# Patient Record
Sex: Male | Born: 1960 | Race: Black or African American | Hispanic: No | Smoking: Never smoker
Health system: Southern US, Community
[De-identification: ages and names within clinical notes are randomized; demographics above are authoritative.]

## PROBLEM LIST (undated history)

## (undated) DIAGNOSIS — R400 Somnolence: Secondary | ICD-10-CM

## (undated) DIAGNOSIS — R079 Chest pain, unspecified: Secondary | ICD-10-CM

## (undated) DIAGNOSIS — I119 Hypertensive heart disease without heart failure: Secondary | ICD-10-CM

## (undated) DIAGNOSIS — R9431 Abnormal electrocardiogram [ECG] [EKG]: Secondary | ICD-10-CM

## (undated) DIAGNOSIS — I480 Paroxysmal atrial fibrillation: Secondary | ICD-10-CM

## (undated) DIAGNOSIS — R002 Palpitations: Secondary | ICD-10-CM

## (undated) DIAGNOSIS — E119 Type 2 diabetes mellitus without complications: Secondary | ICD-10-CM

## (undated) DIAGNOSIS — G4726 Circadian rhythm sleep disorder, shift work type: Secondary | ICD-10-CM

## (undated) DIAGNOSIS — E785 Hyperlipidemia, unspecified: Secondary | ICD-10-CM

## (undated) DIAGNOSIS — I10 Essential (primary) hypertension: Secondary | ICD-10-CM

## (undated) DIAGNOSIS — Z9989 Dependence on other enabling machines and devices: Secondary | ICD-10-CM

## (undated) DIAGNOSIS — G4733 Obstructive sleep apnea (adult) (pediatric): Secondary | ICD-10-CM

## (undated) DIAGNOSIS — U071 COVID-19: Secondary | ICD-10-CM

## (undated) HISTORY — DX: Essential (primary) hypertension: I10

## (undated) HISTORY — DX: Circadian rhythm sleep disorder, shift work type: G47.26

## (undated) HISTORY — DX: Dependence on other enabling machines and devices: Z99.89

## (undated) HISTORY — DX: Abnormal electrocardiogram (ECG) (EKG): R94.31

## (undated) HISTORY — DX: Somnolence: R40.0

## (undated) HISTORY — DX: Chest pain, unspecified: R07.9

## (undated) HISTORY — DX: Palpitations: R00.2

## (undated) HISTORY — DX: Obstructive sleep apnea (adult) (pediatric): G47.33

## (undated) HISTORY — DX: COVID-19: U07.1

## (undated) HISTORY — DX: Hypertensive heart disease without heart failure: I11.9

## (undated) HISTORY — PX: BACK SURGERY: SHX140

---

## 1998-01-24 ENCOUNTER — Encounter: Admission: RE | Admit: 1998-01-24 | Discharge: 1998-04-24 | Payer: Self-pay | Admitting: Anesthesiology

## 1998-05-19 ENCOUNTER — Ambulatory Visit (HOSPITAL_COMMUNITY): Admission: RE | Admit: 1998-05-19 | Discharge: 1998-05-19 | Payer: Self-pay | Admitting: Neurosurgery

## 1998-05-19 ENCOUNTER — Encounter: Payer: Self-pay | Admitting: Neurosurgery

## 1998-06-07 ENCOUNTER — Observation Stay (HOSPITAL_COMMUNITY): Admission: RE | Admit: 1998-06-07 | Discharge: 1998-06-07 | Payer: Self-pay | Admitting: Neurosurgery

## 1998-06-07 ENCOUNTER — Encounter: Payer: Self-pay | Admitting: Neurosurgery

## 1998-09-27 ENCOUNTER — Encounter: Admission: RE | Admit: 1998-09-27 | Discharge: 1998-12-26 | Payer: Self-pay | Admitting: Family Medicine

## 1999-09-15 ENCOUNTER — Encounter: Admission: RE | Admit: 1999-09-15 | Discharge: 1999-09-15 | Payer: Self-pay | Admitting: Family Medicine

## 1999-09-15 ENCOUNTER — Encounter: Payer: Self-pay | Admitting: Family Medicine

## 2002-03-26 ENCOUNTER — Encounter: Admission: RE | Admit: 2002-03-26 | Discharge: 2002-05-26 | Payer: Self-pay | Admitting: Family Medicine

## 2002-06-23 ENCOUNTER — Encounter: Admission: RE | Admit: 2002-06-23 | Discharge: 2002-09-21 | Payer: Self-pay | Admitting: Family Medicine

## 2005-08-13 HISTORY — PX: CARDIAC CATHETERIZATION: SHX172

## 2007-10-08 ENCOUNTER — Ambulatory Visit: Payer: Self-pay | Admitting: Licensed Clinical Social Worker

## 2007-10-14 ENCOUNTER — Ambulatory Visit: Payer: Self-pay | Admitting: Licensed Clinical Social Worker

## 2007-10-20 ENCOUNTER — Ambulatory Visit: Payer: Self-pay | Admitting: Licensed Clinical Social Worker

## 2007-10-27 ENCOUNTER — Ambulatory Visit: Payer: Self-pay | Admitting: Licensed Clinical Social Worker

## 2007-11-03 ENCOUNTER — Ambulatory Visit: Payer: Self-pay | Admitting: Licensed Clinical Social Worker

## 2010-04-15 ENCOUNTER — Inpatient Hospital Stay (HOSPITAL_COMMUNITY): Admission: EM | Admit: 2010-04-15 | Discharge: 2010-04-17 | Payer: Self-pay | Admitting: Emergency Medicine

## 2010-10-26 LAB — GLUCOSE, CAPILLARY
Glucose-Capillary: 108 mg/dL — ABNORMAL HIGH (ref 70–99)
Glucose-Capillary: 119 mg/dL — ABNORMAL HIGH (ref 70–99)
Glucose-Capillary: 121 mg/dL — ABNORMAL HIGH (ref 70–99)
Glucose-Capillary: 128 mg/dL — ABNORMAL HIGH (ref 70–99)
Glucose-Capillary: 132 mg/dL — ABNORMAL HIGH (ref 70–99)
Glucose-Capillary: 145 mg/dL — ABNORMAL HIGH (ref 70–99)
Glucose-Capillary: 162 mg/dL — ABNORMAL HIGH (ref 70–99)
Glucose-Capillary: 164 mg/dL — ABNORMAL HIGH (ref 70–99)
Glucose-Capillary: 182 mg/dL — ABNORMAL HIGH (ref 70–99)
Glucose-Capillary: 187 mg/dL — ABNORMAL HIGH (ref 70–99)
Glucose-Capillary: 197 mg/dL — ABNORMAL HIGH (ref 70–99)
Glucose-Capillary: 213 mg/dL — ABNORMAL HIGH (ref 70–99)
Glucose-Capillary: 226 mg/dL — ABNORMAL HIGH (ref 70–99)
Glucose-Capillary: 275 mg/dL — ABNORMAL HIGH (ref 70–99)
Glucose-Capillary: 276 mg/dL — ABNORMAL HIGH (ref 70–99)
Glucose-Capillary: 308 mg/dL — ABNORMAL HIGH (ref 70–99)
Glucose-Capillary: 351 mg/dL — ABNORMAL HIGH (ref 70–99)
Glucose-Capillary: 477 mg/dL — ABNORMAL HIGH (ref 70–99)
Glucose-Capillary: >600 mg/dL (ref 70–99)
Glucose-Capillary: >600 mg/dL (ref 70–99)
Glucose-Capillary: >600 mg/dL (ref 70–99)

## 2010-10-26 LAB — CBC
HCT: 35.4 % — ABNORMAL LOW (ref 39.0–52.0)
HCT: 41.9 % (ref 39.0–52.0)
Hemoglobin: 11.6 g/dL — ABNORMAL LOW (ref 13.0–17.0)
Hemoglobin: 14.4 g/dL (ref 13.0–17.0)
MCH: 31.8 pg (ref 26.0–34.0)
MCHC: 34.4 g/dL (ref 30.0–36.0)
MCV: 92.5 fL (ref 78.0–100.0)
MCV: 92.6 fL (ref 78.0–100.0)
Platelets: 176 10*3/uL (ref 150–400)
Platelets: 222 10*3/uL (ref 150–400)
Platelets: 274 K/uL (ref 150–400)
RBC: 3.71 MIL/uL — ABNORMAL LOW (ref 4.22–5.81)
RBC: 3.82 MIL/uL — ABNORMAL LOW (ref 4.22–5.81)
RBC: 4.53 MIL/uL (ref 4.22–5.81)
RDW: 13.4 % (ref 11.5–15.5)
WBC: 12.8 10*3/uL — ABNORMAL HIGH (ref 4.0–10.5)
WBC: 16.5 K/uL — ABNORMAL HIGH (ref 4.0–10.5)
WBC: 8.3 10*3/uL (ref 4.0–10.5)

## 2010-10-26 LAB — COMPREHENSIVE METABOLIC PANEL
AST: 34 U/L (ref 0–37)
BUN: 64 mg/dL — ABNORMAL HIGH (ref 6–23)
CO2: 19 mEq/L (ref 19–32)
Chloride: 95 mEq/L — ABNORMAL LOW (ref 96–112)
Creatinine, Ser: 2.61 mg/dL — ABNORMAL HIGH (ref 0.4–1.5)
GFR calc Af Amer: 32 mL/min — ABNORMAL LOW (ref >60)
GFR calc non Af Amer: 26 mL/min — ABNORMAL LOW (ref >60)
Total Bilirubin: 2 mg/dL — ABNORMAL HIGH (ref 0.3–1.2)

## 2010-10-26 LAB — BLOOD GAS, VENOUS
Acid-base deficit: 7.4 mmol/L — ABNORMAL HIGH (ref 0.0–2.0)
Bicarbonate: 17.3 mEq/L — ABNORMAL LOW (ref 20.0–24.0)
FIO2: 0.21 %
O2 Saturation: 79.5 %
Patient temperature: 98.6
TCO2: 15.7 mmol/L (ref 0–100)
pCO2, Ven: 34.1 mmHg — ABNORMAL LOW (ref 45.0–50.0)
pH, Ven: 7.326 — ABNORMAL HIGH (ref 7.250–7.300)
pO2, Ven: 48.3 mmHg — ABNORMAL HIGH (ref 30.0–45.0)

## 2010-10-26 LAB — BASIC METABOLIC PANEL WITH GFR
CO2: 23 meq/L (ref 19–32)
Calcium: 7.9 mg/dL — ABNORMAL LOW (ref 8.4–10.5)
Chloride: 112 meq/L (ref 96–112)
GFR calc Af Amer: 47 mL/min — ABNORMAL LOW (ref >60)
Glucose, Bld: 318 mg/dL — ABNORMAL HIGH (ref 70–99)
Potassium: 4.2 meq/L (ref 3.5–5.1)
Sodium: 144 meq/L (ref 135–145)

## 2010-10-26 LAB — BASIC METABOLIC PANEL
BUN: 52 mg/dL — ABNORMAL HIGH (ref 6–23)
CO2: 24 mEq/L (ref 19–32)
CO2: 25 mEq/L (ref 19–32)
Calcium: 7.4 mg/dL — ABNORMAL LOW (ref 8.4–10.5)
Calcium: 7.5 mg/dL — ABNORMAL LOW (ref 8.4–10.5)
Calcium: 7.8 mg/dL — ABNORMAL LOW (ref 8.4–10.5)
Calcium: 8 mg/dL — ABNORMAL LOW (ref 8.4–10.5)
Chloride: 115 mEq/L — ABNORMAL HIGH (ref 96–112)
Creatinine, Ser: 0.97 mg/dL (ref 0.4–1.5)
Creatinine, Ser: 1.19 mg/dL (ref 0.4–1.5)
Creatinine, Ser: 1.86 mg/dL — ABNORMAL HIGH (ref 0.4–1.5)
GFR calc Af Amer: 57 mL/min — ABNORMAL LOW (ref >60)
GFR calc Af Amer: >60 mL/min (ref >60)
GFR calc Af Amer: >60 mL/min (ref >60)
GFR calc Af Amer: >60 mL/min (ref >60)
GFR calc non Af Amer: 39 mL/min — ABNORMAL LOW (ref >60)
GFR calc non Af Amer: 58 mL/min — ABNORMAL LOW (ref >60)
GFR calc non Af Amer: >60 mL/min (ref >60)
Glucose, Bld: 147 mg/dL — ABNORMAL HIGH (ref 70–99)
Potassium: 4.4 mEq/L (ref 3.5–5.1)
Sodium: 136 mEq/L (ref 135–145)
Sodium: 141 mEq/L (ref 135–145)
Sodium: 143 mEq/L (ref 135–145)
Sodium: 147 mEq/L — ABNORMAL HIGH (ref 135–145)

## 2010-10-26 LAB — DIFFERENTIAL
Basophils Absolute: 0 10*3/uL (ref 0.0–0.1)
Basophils Relative: 0 % (ref 0–1)
Basophils Relative: 1 % (ref 0–1)
Eosinophils Absolute: 0 K/uL (ref 0.0–0.7)
Eosinophils Relative: 0 % (ref 0–5)
Eosinophils Relative: 0 % (ref 0–5)
Eosinophils Relative: 0 % (ref 0–5)
Lymphocytes Relative: 12 % (ref 12–46)
Lymphocytes Relative: 7 % — ABNORMAL LOW (ref 12–46)
Lymphocytes Relative: 9 % — ABNORMAL LOW (ref 12–46)
Lymphs Abs: 1.1 10*3/uL (ref 0.7–4.0)
Lymphs Abs: 1.1 K/uL (ref 0.7–4.0)
Monocytes Absolute: 1.1 K/uL — ABNORMAL HIGH (ref 0.1–1.0)
Monocytes Relative: 2 % — ABNORMAL LOW (ref 3–12)
Monocytes Relative: 7 % (ref 3–12)
Neutro Abs: 10 10*3/uL — ABNORMAL HIGH (ref 1.7–7.7)
Neutro Abs: 14.3 K/uL — ABNORMAL HIGH (ref 1.7–7.7)
Neutrophils Relative %: 85 % — ABNORMAL HIGH (ref 43–77)
Neutrophils Relative %: 87 % — ABNORMAL HIGH (ref 43–77)

## 2010-10-26 LAB — COMPREHENSIVE METABOLIC PANEL WITH GFR
ALT: 27 U/L (ref 0–53)
Albumin: 4.4 g/dL (ref 3.5–5.2)
Alkaline Phosphatase: 68 U/L (ref 39–117)
Calcium: 8.4 mg/dL (ref 8.4–10.5)
Glucose, Bld: 770 mg/dL (ref 70–99)
Potassium: 4.3 meq/L (ref 3.5–5.1)
Sodium: 135 meq/L (ref 135–145)
Total Protein: 7.4 g/dL (ref 6.0–8.3)

## 2010-10-26 LAB — KETONES, QUALITATIVE

## 2010-10-26 LAB — PHOSPHORUS
Phosphorus: 2.5 mg/dL (ref 2.3–4.6)
Phosphorus: 2.5 mg/dL (ref 2.3–4.6)
Phosphorus: 3.4 mg/dL (ref 2.3–4.6)

## 2010-10-26 LAB — MAGNESIUM
Magnesium: 2.8 mg/dL — ABNORMAL HIGH (ref 1.5–2.5)
Magnesium: 2.9 mg/dL — ABNORMAL HIGH (ref 1.5–2.5)
Magnesium: 2.9 mg/dL — ABNORMAL HIGH (ref 1.5–2.5)

## 2010-10-26 LAB — URINALYSIS, ROUTINE W REFLEX MICROSCOPIC
Bilirubin Urine: NEGATIVE
Glucose, UA: >1000 mg/dL — AB
Hgb urine dipstick: NEGATIVE
Ketones, ur: 15 mg/dL — AB
Leukocytes, UA: NEGATIVE
Nitrite: NEGATIVE
Protein, ur: NEGATIVE mg/dL
Specific Gravity, Urine: 1.028 (ref 1.005–1.030)
Urobilinogen, UA: 0.2 mg/dL (ref 0.0–1.0)
pH: 5 (ref 5.0–8.0)

## 2010-10-26 LAB — CARDIAC PANEL(CRET KIN+CKTOT+MB+TROPI)
CK, MB: 3.2 ng/mL (ref 0.3–4.0)
CK, MB: 5.7 ng/mL — ABNORMAL HIGH (ref 0.3–4.0)
Relative Index: 1.1 (ref 0.0–2.5)
Relative Index: 1.8 (ref 0.0–2.5)
Total CK: 274 U/L — ABNORMAL HIGH (ref 7–232)
Total CK: 275 U/L — ABNORMAL HIGH (ref 7–232)
Troponin I: 0.02 ng/mL (ref 0.00–0.06)

## 2010-10-26 LAB — LIPASE, BLOOD: Lipase: 13 U/L (ref 11–59)

## 2010-10-26 LAB — HEMOGLOBIN A1C: Mean Plasma Glucose: 186 mg/dL — ABNORMAL HIGH (ref <117)

## 2010-10-26 LAB — URINE MICROSCOPIC-ADD ON

## 2011-11-27 ENCOUNTER — Other Ambulatory Visit: Payer: Self-pay | Admitting: Family Medicine

## 2011-11-27 DIAGNOSIS — M5416 Radiculopathy, lumbar region: Secondary | ICD-10-CM

## 2011-12-03 ENCOUNTER — Ambulatory Visit
Admission: RE | Admit: 2011-12-03 | Discharge: 2011-12-03 | Disposition: A | Discharge status: Home / Self Care | Payer: 59 | Source: Ambulatory Visit | Attending: Family Medicine | Admitting: Family Medicine

## 2011-12-03 DIAGNOSIS — M5416 Radiculopathy, lumbar region: Secondary | ICD-10-CM

## 2011-12-03 MED ORDER — GADOBENATE DIMEGLUMINE 529 MG/ML IV SOLN
16.0000 mL | Freq: Once | INTRAVENOUS | Status: AC | PRN
  Administered 2011-12-03: 16 mL via INTRAVENOUS

## 2012-07-14 ENCOUNTER — Emergency Department (HOSPITAL_COMMUNITY)
Admission: EM | Admit: 2012-07-14 | Discharge: 2012-07-14 | Disposition: A | Discharge status: Home / Self Care | Payer: 59 | Attending: Emergency Medicine | Admitting: Emergency Medicine

## 2012-07-14 ENCOUNTER — Encounter (HOSPITAL_COMMUNITY): Payer: Self-pay | Admitting: Emergency Medicine

## 2012-07-14 DIAGNOSIS — R739 Hyperglycemia, unspecified: Secondary | ICD-10-CM

## 2012-07-14 DIAGNOSIS — E785 Hyperlipidemia, unspecified: Secondary | ICD-10-CM | POA: Insufficient documentation

## 2012-07-14 DIAGNOSIS — Z794 Long term (current) use of insulin: Secondary | ICD-10-CM | POA: Insufficient documentation

## 2012-07-14 DIAGNOSIS — E1169 Type 2 diabetes mellitus with other specified complication: Secondary | ICD-10-CM | POA: Insufficient documentation

## 2012-07-14 DIAGNOSIS — Z7982 Long term (current) use of aspirin: Secondary | ICD-10-CM | POA: Insufficient documentation

## 2012-07-14 DIAGNOSIS — Z79899 Other long term (current) drug therapy: Secondary | ICD-10-CM | POA: Insufficient documentation

## 2012-07-14 DIAGNOSIS — E860 Dehydration: Secondary | ICD-10-CM | POA: Insufficient documentation

## 2012-07-14 DIAGNOSIS — R112 Nausea with vomiting, unspecified: Secondary | ICD-10-CM | POA: Insufficient documentation

## 2012-07-14 HISTORY — DX: Hyperlipidemia, unspecified: E78.5

## 2012-07-14 HISTORY — DX: Type 2 diabetes mellitus without complications: E11.9

## 2012-07-14 LAB — CBC WITH DIFFERENTIAL/PLATELET
Basophils Absolute: 0 10*3/uL (ref 0.0–0.1)
Basophils Relative: 0 % (ref 0–1)
HCT: 43.5 % (ref 39.0–52.0)
Lymphocytes Relative: 6 % — ABNORMAL LOW (ref 12–46)
MCHC: 33.6 g/dL (ref 30.0–36.0)
Monocytes Absolute: 0.5 10*3/uL (ref 0.1–1.0)
Neutro Abs: 5.8 10*3/uL (ref 1.7–7.7)
Neutrophils Relative %: 86 % — ABNORMAL HIGH (ref 43–77)
RDW: 12.5 % (ref 11.5–15.5)
WBC: 6.7 10*3/uL (ref 4.0–10.5)

## 2012-07-14 LAB — URINALYSIS, MICROSCOPIC ONLY
Glucose, UA: >1000 mg/dL — AB
Hgb urine dipstick: NEGATIVE
Ketones, ur: 40 mg/dL — AB
Leukocytes, UA: NEGATIVE
Protein, ur: NEGATIVE mg/dL
pH: 5 (ref 5.0–8.0)

## 2012-07-14 LAB — COMPREHENSIVE METABOLIC PANEL
ALT: 17 U/L (ref 0–53)
AST: 20 U/L (ref 0–37)
Albumin: 3.9 g/dL (ref 3.5–5.2)
Alkaline Phosphatase: 56 U/L (ref 39–117)
CO2: 29 mEq/L (ref 19–32)
Chloride: 100 mEq/L (ref 96–112)
GFR calc non Af Amer: >90 mL/min (ref >90)
Potassium: 4.8 mEq/L (ref 3.5–5.1)
Sodium: 140 mEq/L (ref 135–145)
Total Bilirubin: 1.5 mg/dL — ABNORMAL HIGH (ref 0.3–1.2)

## 2012-07-14 LAB — GLUCOSE, CAPILLARY: Glucose-Capillary: 201 mg/dL — ABNORMAL HIGH (ref 70–99)

## 2012-07-14 MED ORDER — ONDANSETRON HCL 8 MG PO TABS: 8.0000 mg | ORAL_TABLET | Freq: Three times a day (TID) | ORAL | Status: DC | PRN

## 2012-07-14 MED ORDER — INSULIN ASPART 100 UNIT/ML IV SOLN
8.0000 [IU] | Freq: Once | INTRAVENOUS | Status: DC
  Filled 2012-07-14: qty 0.08

## 2012-07-14 MED ORDER — SODIUM CHLORIDE 0.9 % IV BOLUS (SEPSIS)
1000.0000 mL | Freq: Once | INTRAVENOUS | Status: AC
  Administered 2012-07-14: 1000 mL via INTRAVENOUS

## 2012-07-14 MED ORDER — INSULIN ASPART 100 UNIT/ML ~~LOC~~ SOLN
SUBCUTANEOUS | Status: AC
  Administered 2012-07-14: 8 [IU]
  Filled 2012-07-14: qty 1

## 2012-07-14 MED ORDER — SODIUM CHLORIDE 0.9 % IV BOLUS (SEPSIS): 1000.0000 mL | Freq: Once | INTRAVENOUS | Status: DC

## 2012-07-14 MED ORDER — METOCLOPRAMIDE HCL 5 MG/ML IJ SOLN
10.0000 mg | Freq: Once | INTRAMUSCULAR | Status: AC
  Administered 2012-07-14: 10 mg via INTRAVENOUS
  Filled 2012-07-14: qty 2

## 2012-07-14 MED ORDER — SODIUM CHLORIDE 0.9 % IV SOLN
1000.0000 mL | Freq: Once | INTRAVENOUS | Status: AC
  Administered 2012-07-14: 1000 mL via INTRAVENOUS

## 2012-07-14 MED ORDER — ONDANSETRON HCL 4 MG/2ML IJ SOLN
4.0000 mg | Freq: Once | INTRAMUSCULAR | Status: AC
  Administered 2012-07-14: 4 mg via INTRAVENOUS
  Filled 2012-07-14: qty 2

## 2012-07-14 MED ORDER — ONDANSETRON HCL 4 MG/2ML IJ SOLN: 4.0000 mg | Freq: Once | INTRAMUSCULAR | Status: DC

## 2012-07-14 MED ORDER — PANTOPRAZOLE SODIUM 40 MG IV SOLR
40.0000 mg | Freq: Once | INTRAVENOUS | Status: AC
  Administered 2012-07-14: 40 mg via INTRAVENOUS
  Filled 2012-07-14: qty 40

## 2012-07-14 NOTE — ED Notes (Signed)
Pt presenting to ed with c/o abdominal pain with positive nausea and vomiting. Pt denies diarrhea at this time. Pt states onset yesterday

## 2012-07-14 NOTE — ED Notes (Signed)
Pt ate at [employer] last night, about 9pm started vomiting, vomited again this am, denies diarrhea, denies fever, pt is a diabetic and blood sugar is elevated

## 2012-07-14 NOTE — ED Provider Notes (Signed)
History     CSN: [id number]  Arrival date & time 07/14/12  1114   First MD Initiated Contact with Patient 07/14/12 1309      Chief Complaint  Patient presents with  . Abdominal Pain    (Consider location/radiation/quality/duration/timing/severity/associated sxs/prior treatment) The history is provided by the patient.  pt with iddm, c/o recurrent nv since last pm. Emesis c/w color recently ingested food/liquids, no bloody or bilious emesis. Denies abd pain. No diarrhea or constipation, states had normal bm today. denies any specific exacerbating or alleviating factors. No abd distension. No prior abd surgery. Denies fever or chills. No known ill contacts or bad food ingestion.  States had admission for similar symptoms approximately 2 yrs ago, was in [facility] then. No chest pain or discomfort. No sob. No gu c/o.     Past Medical History  Diagnosis Date  . Hyperlipidemia   . Diabetes mellitus without complication     Past Surgical History  Procedure Date  . Back surgery     No family history on file.  History  Substance Use Topics  . Smoking status: Never Smoker   . Smokeless tobacco: Not on file  . Alcohol Use: No      Review of Systems  Constitutional: Negative for fever.  HENT: Negative for neck pain.   Eyes: Negative for redness.  Respiratory: Negative for cough and shortness of breath.   Cardiovascular: Negative for chest pain.  Gastrointestinal: Negative for abdominal pain.  Genitourinary: Negative for dysuria and flank pain.  Musculoskeletal: Negative for back pain.  Skin: Negative for rash.  Neurological: Negative for headaches.  Hematological: Does not bruise/bleed easily.  Psychiatric/Behavioral: Negative for confusion.    Allergies  Review of patient's allergies indicates no known allergies.  Home Medications   Current Outpatient Rx  Name  Route  Sig  Dispense  Refill  . ASPIRIN 81 MG PO CHEW   Oral   Chew 81 mg by mouth daily.         .  ATORVASTATIN CALCIUM 20 MG PO TABS   Oral   Take 20 mg by mouth at bedtime.         . INSULIN PUMP   Subcutaneous   Inject 1 each into the skin as directed. Replace insulin vial in pump every 3 days (patient uses Humalog)           BP 122/76  Pulse 98  Temp 98.1 F (36.7 C) (Oral)  Resp 18  SpO2 99%  Physical Exam  Nursing note and vitals reviewed. Constitutional: He is oriented to person, place, and time. He appears well-developed and well-nourished. No distress.  HENT:  Head: Atraumatic.  Mouth/Throat: Oropharynx is clear and moist.  Eyes: Conjunctivae normal are normal. Pupils are equal, round, and reactive to light. No scleral icterus.  Neck: Neck supple. No tracheal deviation present.  Cardiovascular: Normal rate, regular rhythm, normal heart sounds and intact distal pulses.   Pulmonary/Chest: Effort normal and breath sounds normal. No accessory muscle usage. No respiratory distress.  Abdominal: Soft. Bowel sounds are normal. He exhibits no distension and no mass. There is no tenderness. There is no rebound and no guarding.  Genitourinary:       No cva tenderness  Musculoskeletal: Normal range of motion. He exhibits no edema and no tenderness.  Neurological: He is alert and oriented to person, place, and time.  Skin: Skin is warm and dry.  Psychiatric: He has a normal mood and affect.    ED  Course  Procedures (including critical care time)  Labs Reviewed  CBC WITH DIFFERENTIAL - Abnormal; Notable for the following:    Neutrophils Relative 86 (*)     Lymphocytes Relative 6 (*)     Lymphs Abs 0.4 (*)     All other components within normal limits  COMPREHENSIVE METABOLIC PANEL - Abnormal; Notable for the following:    Glucose, Bld 338 (*)     Total Bilirubin 1.5 (*)     All other components within normal limits  LIPASE, BLOOD - Abnormal; Notable for the following:    Lipase 10 (*)     All other components within normal limits  GLUCOSE, CAPILLARY - Abnormal;  Notable for the following:    Glucose-Capillary 307 (*)     All other components within normal limits  URINALYSIS, MICROSCOPIC ONLY   Results for orders placed during the hospital encounter of 07/14/12  CBC WITH DIFFERENTIAL      Component Value Range   WBC 6.7  4.0 - 10.5 K/uL   RBC 4.79  4.22 - 5.81 MIL/uL   Hemoglobin 14.6  13.0 - 17.0 g/dL   HCT 16.1  09.6 - 04.5 %   MCV 90.8  78.0 - 100.0 fL   MCH 30.5  26.0 - 34.0 pg   MCHC 33.6  30.0 - 36.0 g/dL   RDW 40.9  81.1 - 91.4 %   Platelets 226  150 - 400 K/uL   Neutrophils Relative 86 (*) 43 - 77 %   Neutro Abs 5.8  1.7 - 7.7 K/uL   Lymphocytes Relative 6 (*) 12 - 46 %   Lymphs Abs 0.4 (*) 0.7 - 4.0 K/uL   Monocytes Relative 8  3 - 12 %   Monocytes Absolute 0.5  0.1 - 1.0 K/uL   Eosinophils Relative 0  0 - 5 %   Eosinophils Absolute 0.0  0.0 - 0.7 K/uL   Basophils Relative 0  0 - 1 %   Basophils Absolute 0.0  0.0 - 0.1 K/uL  COMPREHENSIVE METABOLIC PANEL      Component Value Range   Sodium 140  135 - 145 mEq/L   Potassium 4.8  3.5 - 5.1 mEq/L   Chloride 100  96 - 112 mEq/L   CO2 29  19 - 32 mEq/L   Glucose, Bld 338 (*) 70 - 99 mg/dL   BUN 23  6 - 23 mg/dL   Creatinine, Ser 7.82  0.50 - 1.35 mg/dL   Calcium 9.2  8.4 - 95.6 mg/dL   Total Protein 7.1  6.0 - 8.3 g/dL   Albumin 3.9  3.5 - 5.2 g/dL   AST 20  0 - 37 U/L   ALT 17  0 - 53 U/L   Alkaline Phosphatase 56  39 - 117 U/L   Total Bilirubin 1.5 (*) 0.3 - 1.2 mg/dL   GFR calc non Af Amer >90  >90 mL/min   GFR calc Af Amer >90  >90 mL/min  LIPASE, BLOOD      Component Value Range   Lipase 10 (*) 11 - 59 U/L  URINALYSIS, MICROSCOPIC ONLY      Component Value Range   Color, Urine YELLOW  YELLOW   APPearance CLEAR  CLEAR   Specific Gravity, Urine >1.046 (*) 1.005 - 1.030   pH 5.0  5.0 - 8.0   Glucose, UA >1000 (*) NEGATIVE mg/dL   Hgb urine dipstick NEGATIVE  NEGATIVE   Bilirubin Urine NEGATIVE  NEGATIVE  Ketones, ur 40 (*) NEGATIVE mg/dL   Protein, ur NEGATIVE   NEGATIVE mg/dL   Urobilinogen, UA 1.0  0.0 - 1.0 mg/dL   Nitrite NEGATIVE  NEGATIVE   Leukocytes, UA NEGATIVE  NEGATIVE   WBC, UA 0-2  <3 WBC/hpf  GLUCOSE, CAPILLARY      Component Value Range   Glucose-Capillary 307 (*) 70 - 99 mg/dL       MDM  Iv ns bolus.   reglan iv (diabetic, ?gastroparesis).  Additional ns bolus iv. Insulin, protonix, zofran.  Recheck no recurrent emesis.  Recheck feels much improved. abd soft nt. No nv.           Suzi Roots, MD 07/14/12 [phone]

## 2012-07-14 NOTE — ED Notes (Signed)
MD at bedside. 

## 2012-07-14 NOTE — ED Notes (Signed)
Family at bedside. 

## 2012-07-14 NOTE — ED Notes (Signed)
Iv team at bedside  

## 2012-12-09 ENCOUNTER — Encounter (HOSPITAL_COMMUNITY): Payer: Self-pay | Admitting: Emergency Medicine

## 2012-12-09 ENCOUNTER — Emergency Department (HOSPITAL_COMMUNITY)
Admission: EM | Admit: 2012-12-09 | Discharge: 2012-12-09 | Disposition: A | Discharge status: Home / Self Care | Payer: 59 | Attending: Emergency Medicine | Admitting: Emergency Medicine

## 2012-12-09 DIAGNOSIS — K5289 Other specified noninfective gastroenteritis and colitis: Secondary | ICD-10-CM | POA: Insufficient documentation

## 2012-12-09 DIAGNOSIS — Z79899 Other long term (current) drug therapy: Secondary | ICD-10-CM | POA: Insufficient documentation

## 2012-12-09 DIAGNOSIS — K529 Noninfective gastroenteritis and colitis, unspecified: Secondary | ICD-10-CM

## 2012-12-09 DIAGNOSIS — E109 Type 1 diabetes mellitus without complications: Secondary | ICD-10-CM | POA: Insufficient documentation

## 2012-12-09 DIAGNOSIS — Z794 Long term (current) use of insulin: Secondary | ICD-10-CM | POA: Insufficient documentation

## 2012-12-09 LAB — URINALYSIS, ROUTINE W REFLEX MICROSCOPIC
Glucose, UA: >1000 mg/dL — AB
Leukocytes, UA: NEGATIVE
Nitrite: NEGATIVE
Specific Gravity, Urine: 1.04 — ABNORMAL HIGH (ref 1.005–1.030)
pH: 5.5 (ref 5.0–8.0)

## 2012-12-09 LAB — CBC WITH DIFFERENTIAL/PLATELET
Basophils Absolute: 0 10*3/uL (ref 0.0–0.1)
Basophils Relative: 0 % (ref 0–1)
HCT: 43.8 % (ref 39.0–52.0)
Hemoglobin: 14.8 g/dL (ref 13.0–17.0)
Lymphocytes Relative: 9 % — ABNORMAL LOW (ref 12–46)
MCHC: 33.8 g/dL (ref 30.0–36.0)
Monocytes Relative: 9 % (ref 3–12)
Neutro Abs: 4.8 10*3/uL (ref 1.7–7.7)
Neutrophils Relative %: 82 % — ABNORMAL HIGH (ref 43–77)
RDW: 12.4 % (ref 11.5–15.5)
WBC: 5.9 10*3/uL (ref 4.0–10.5)

## 2012-12-09 LAB — BASIC METABOLIC PANEL
CO2: 27 mEq/L (ref 19–32)
Chloride: 100 mEq/L (ref 96–112)
GFR calc Af Amer: >90 mL/min (ref >90)
Potassium: 4.3 mEq/L (ref 3.5–5.1)

## 2012-12-09 LAB — URINE MICROSCOPIC-ADD ON

## 2012-12-09 LAB — GLUCOSE, CAPILLARY: Glucose-Capillary: 255 mg/dL — ABNORMAL HIGH (ref 70–99)

## 2012-12-09 MED ORDER — SODIUM CHLORIDE 0.9 % IV BOLUS (SEPSIS)
1000.0000 mL | Freq: Once | INTRAVENOUS | Status: AC
  Administered 2012-12-09: 1000 mL via INTRAVENOUS

## 2012-12-09 MED ORDER — ONDANSETRON HCL 8 MG PO TABS: 8.0000 mg | ORAL_TABLET | ORAL | Status: DC | PRN

## 2012-12-09 MED ORDER — METOCLOPRAMIDE HCL 5 MG/ML IJ SOLN
10.0000 mg | Freq: Once | INTRAMUSCULAR | Status: AC
  Administered 2012-12-09: 10 mg via INTRAVENOUS
  Filled 2012-12-09: qty 2

## 2012-12-09 MED ORDER — ONDANSETRON HCL 4 MG/2ML IJ SOLN
4.0000 mg | Freq: Once | INTRAMUSCULAR | Status: AC
  Administered 2012-12-09: 4 mg via INTRAVENOUS
  Filled 2012-12-09: qty 2

## 2012-12-09 NOTE — ED Provider Notes (Signed)
History     CSN: [id number]  Arrival date & time 12/09/12  1203   First MD Initiated Contact with Patient 12/09/12 1251      Chief Complaint  Patient presents with  . Nausea    (Consider location/radiation/quality/duration/timing/severity/associated sxs/prior treatment) HPI.... nausea since last evening..  Glucose has fluctuated from 70-270.   Patient uses an insulin pump.  Nothing makes symptoms better or worse. Severity is moderate. No abdominal pain, chest pain, dyspnea, dysuria, cough.    Past Medical History  Diagnosis Date  . Hyperlipidemia   . Diabetes mellitus without complication     Past Surgical History  Procedure Laterality Date  . Back surgery      No family history on file.  History  Substance Use Topics  . Smoking status: Never Smoker   . Smokeless tobacco: Not on file  . Alcohol Use: No      Review of Systems  All other systems reviewed and are negative.    Allergies  Review of patient's allergies indicates no known allergies.  Home Medications   Current Outpatient Rx  Name  Route  Sig  Dispense  Refill  . atorvastatin (LIPITOR) 20 MG tablet   Oral   Take 20 mg by mouth at bedtime.         . celecoxib (CELEBREX) 200 MG capsule   Oral   Take 200 mg by mouth daily.         . Insulin Human (INSULIN PUMP) 100 unit/ml SOLN   Subcutaneous   Inject 1 each into the skin as directed. Replace insulin vial in pump every 3 days (patient uses Humalog)         . ondansetron (ZOFRAN) 8 MG tablet   Oral   Take 1 tablet (8 mg total) by mouth every 4 (four) hours as needed for nausea.   15 tablet   1     BP 135/90  Pulse 75  Temp(Src) 98 F (36.7 C) (Oral)  SpO2 100%  Physical Exam  Nursing note and vitals reviewed. Constitutional: He is oriented to person, place, and time.  Patient is alert,  slightly dehydrated  HENT:  Head: Normocephalic and atraumatic.  Eyes: Conjunctivae and EOM are normal. Pupils are equal, round, and  reactive to light.  Neck: Normal range of motion. Neck supple.  Cardiovascular: Normal rate, regular rhythm and normal heart sounds.   Pulmonary/Chest: Effort normal and breath sounds normal.  Abdominal: Soft. Bowel sounds are normal.  Musculoskeletal: Normal range of motion.  Neurological: He is alert and oriented to person, place, and time.  Skin: Skin is warm and dry.  Psychiatric: He has a normal mood and affect.    ED Course  Procedures (including critical care time)  Labs Reviewed  GLUCOSE, CAPILLARY - Abnormal; Notable for the following:    Glucose-Capillary 255 (*)    All other components within normal limits  CBC WITH DIFFERENTIAL - Abnormal; Notable for the following:    Neutrophils Relative 82 (*)    Lymphocytes Relative 9 (*)    Lymphs Abs 0.5 (*)    All other components within normal limits  BASIC METABOLIC PANEL - Abnormal; Notable for the following:    Glucose, Bld 292 (*)    All other components within normal limits  URINALYSIS, ROUTINE W REFLEX MICROSCOPIC - Abnormal; Notable for the following:    Specific Gravity, Urine 1.040 (*)    Glucose, UA >1000 (*)    Ketones, ur 15 (*)  All other components within normal limits  URINE MICROSCOPIC-ADD ON   No results found.   1. Gastroenteritis   2. Diabetes mellitus type 1       MDM  Patient feels much better and IV fluids, IV Zofran, IV Reglan.   Discharge meds Zofran 8 mg #15/1        Donnetta Hutching, MD 12/09/12 1623

## 2012-12-09 NOTE — ED Notes (Signed)
Pt states that he is a diabetic and has not been feeling well since last night and his sugar has been elevated pt has a pump and gave insulin and then his sugar dropped. States that he has not been able to eat or drink much and mouth feels dry.

## 2014-09-06 ENCOUNTER — Emergency Department (HOSPITAL_COMMUNITY)
Admission: EM | Admit: 2014-09-06 | Discharge: 2014-09-06 | Disposition: A | Discharge status: Home / Self Care | Payer: 59 | Attending: Emergency Medicine | Admitting: Emergency Medicine

## 2014-09-06 ENCOUNTER — Encounter (HOSPITAL_COMMUNITY): Payer: Self-pay | Admitting: Emergency Medicine

## 2014-09-06 DIAGNOSIS — E119 Type 2 diabetes mellitus without complications: Secondary | ICD-10-CM | POA: Diagnosis not present

## 2014-09-06 DIAGNOSIS — R112 Nausea with vomiting, unspecified: Secondary | ICD-10-CM | POA: Diagnosis present

## 2014-09-06 DIAGNOSIS — Z794 Long term (current) use of insulin: Secondary | ICD-10-CM | POA: Insufficient documentation

## 2014-09-06 DIAGNOSIS — R197 Diarrhea, unspecified: Secondary | ICD-10-CM

## 2014-09-06 DIAGNOSIS — E785 Hyperlipidemia, unspecified: Secondary | ICD-10-CM | POA: Insufficient documentation

## 2014-09-06 LAB — COMPREHENSIVE METABOLIC PANEL
ALK PHOS: 52 U/L (ref 39–117)
ALT: 24 U/L (ref 0–53)
AST: 33 U/L (ref 0–37)
Albumin: 4.4 g/dL (ref 3.5–5.2)
Anion gap: 7 (ref 5–15)
BILIRUBIN TOTAL: 1.4 mg/dL — AB (ref 0.3–1.2)
BUN: 21 mg/dL (ref 6–23)
CO2: 31 mmol/L (ref 19–32)
Calcium: 9.1 mg/dL (ref 8.4–10.5)
Chloride: 104 mmol/L (ref 96–112)
Creatinine, Ser: 0.92 mg/dL (ref 0.50–1.35)
GFR calc non Af Amer: >90 mL/min (ref >90)
GLUCOSE: 144 mg/dL — AB (ref 70–99)
Potassium: 4.4 mmol/L (ref 3.5–5.1)
SODIUM: 142 mmol/L (ref 135–145)
TOTAL PROTEIN: 7.7 g/dL (ref 6.0–8.3)

## 2014-09-06 LAB — CBC WITH DIFFERENTIAL/PLATELET
BASOS PCT: 0 % (ref 0–1)
Basophils Absolute: 0 10*3/uL (ref 0.0–0.1)
EOS ABS: 0 10*3/uL (ref 0.0–0.7)
Eosinophils Relative: 0 % (ref 0–5)
HEMATOCRIT: 47.3 % (ref 39.0–52.0)
HEMOGLOBIN: 15.4 g/dL (ref 13.0–17.0)
LYMPHS ABS: 0.7 10*3/uL (ref 0.7–4.0)
Lymphocytes Relative: 16 % (ref 12–46)
MCH: 30.6 pg (ref 26.0–34.0)
MCHC: 32.6 g/dL (ref 30.0–36.0)
MCV: 94 fL (ref 78.0–100.0)
Monocytes Absolute: 0.7 10*3/uL (ref 0.1–1.0)
Monocytes Relative: 14 % — ABNORMAL HIGH (ref 3–12)
NEUTROS ABS: 3.1 10*3/uL (ref 1.7–7.7)
Neutrophils Relative %: 70 % (ref 43–77)
PLATELETS: 196 10*3/uL (ref 150–400)
RBC: 5.03 MIL/uL (ref 4.22–5.81)
RDW: 12.7 % (ref 11.5–15.5)
WBC: 4.5 10*3/uL (ref 4.0–10.5)

## 2014-09-06 LAB — CBG MONITORING, ED
GLUCOSE-CAPILLARY: 133 mg/dL — AB (ref 70–99)
Glucose-Capillary: 123 mg/dL — ABNORMAL HIGH (ref 70–99)

## 2014-09-06 MED ORDER — PROMETHAZINE HCL 25 MG/ML IJ SOLN
25.0000 mg | Freq: Once | INTRAMUSCULAR | Status: AC
  Administered 2014-09-06: 25 mg via INTRAVENOUS
  Filled 2014-09-06: qty 1

## 2014-09-06 MED ORDER — ONDANSETRON HCL 4 MG/2ML IJ SOLN
4.0000 mg | Freq: Once | INTRAMUSCULAR | Status: AC
  Administered 2014-09-06: 4 mg via INTRAVENOUS
  Filled 2014-09-06: qty 2

## 2014-09-06 MED ORDER — ONDANSETRON HCL 4 MG PO TABS: 4.0000 mg | ORAL_TABLET | Freq: Four times a day (QID) | ORAL | Status: DC

## 2014-09-06 MED ORDER — METOCLOPRAMIDE HCL 5 MG/ML IJ SOLN
10.0000 mg | Freq: Once | INTRAMUSCULAR | Status: AC
  Administered 2014-09-06: 10 mg via INTRAVENOUS
  Filled 2014-09-06: qty 2

## 2014-09-06 MED ORDER — PROMETHAZINE HCL 25 MG RE SUPP: 25.0000 mg | Freq: Four times a day (QID) | RECTAL | Status: DC | PRN

## 2014-09-06 MED ORDER — PROMETHAZINE HCL 25 MG PO TABS: 25.0000 mg | ORAL_TABLET | Freq: Four times a day (QID) | ORAL | Status: DC | PRN

## 2014-09-06 MED ORDER — SODIUM CHLORIDE 0.9 % IV BOLUS (SEPSIS)
1000.0000 mL | Freq: Once | INTRAVENOUS | Status: AC
  Administered 2014-09-06: 1000 mL via INTRAVENOUS

## 2014-09-06 NOTE — ED Notes (Signed)
Pt still feeling slightly nauseous. Attempting to sip on ginger ale now.

## 2014-09-06 NOTE — ED Provider Notes (Signed)
CSN: [id number]     Arrival date & time 09/06/14  1446 History   First MD Initiated Contact with Patient 09/06/14 1519     Chief Complaint  Patient presents with  . Abdominal Pain  . Nausea     (Consider location/radiation/quality/duration/timing/severity/associated sxs/prior Treatment) HPI  Pt presenting with c/o nausea since yesterday evening.  No significant abdominal pain.  Had one episode of loose stools.  No fever/chills.  He has hx of DM and wears insulin pump.  He states his blood sugars have been in good control.  Upon arrival to the ED he has begun vomiting- nonbloody/nonbilious.  There are no other associated systemic symptoms, there are no other alleviating or modifying factors.  Symptoms are constant and moderate.  He states mulitple family members have had similar symptoms with vomiting and diarrhea- his wife just got over a similar illness.  He has not had any treatment prior to arrival.   Past Medical History  Diagnosis Date  . Hyperlipidemia   . Diabetes mellitus without complication    Past Surgical History  Procedure Laterality Date  . Back surgery     No family history on file. History  Substance Use Topics  . Smoking status: Never Smoker   . Smokeless tobacco: Not on file  . Alcohol Use: No    Review of Systems  ROS reviewed and all otherwise negative except for mentioned in HPI    Allergies  Review of patient's allergies indicates no known allergies.  Home Medications   Prior to Admission medications   Medication Sig Start Date End Date Taking? Authorizing Provider  atorvastatin (LIPITOR) 20 MG tablet Take 20 mg by mouth at bedtime.   Yes Historical Provider, MD  Insulin Human (INSULIN PUMP) 100 unit/ml SOLN Inject 1 each into the skin as directed. Replace insulin vial in pump every 3 days (patient uses Humalog)   Yes Historical Provider, MD  ondansetron (ZOFRAN) 4 MG tablet Take 1 tablet (4 mg total) by mouth every 6 (six) hours. 09/06/14   Ethelda ChickMartha K  Linker, MD  promethazine (PHENERGAN) 25 MG suppository Place 1 suppository (25 mg total) rectally every 6 (six) hours as needed for nausea or vomiting. 09/06/14   Ethelda ChickMartha K Linker, MD  promethazine (PHENERGAN) 25 MG tablet Take 1 tablet (25 mg total) by mouth every 6 (six) hours as needed for nausea or vomiting. 09/06/14   Ethelda ChickMartha K Linker, MD   BP 133/78 mmHg  Pulse 81  Temp(Src) 98 F (36.7 C) (Oral)  Resp 18  SpO2 96%  Vitals reviewed Physical Exam  Physical Examination: General appearance - alert, ill appearing- actively vomiting, and in no distress Mental status - alert, oriented to person, place, and time Eyes - no conjunctival injection, no scleral icterus Mouth - mucous membranes moist, pharynx normal without lesions Chest - clear to auscultation, no wheezes, rales or rhonchi, symmetric air entry Heart - normal rate, regular rhythm, normal S1, S2, no murmurs, rubs, clicks or gallops Abdomen - soft, nontender, nondistended, no masses or organomegaly Extremities - peripheral pulses normal, no pedal edema, no clubbing or cyanosis Skin - normal coloration and turgor, no rashes  ED Course  Procedures (including critical care time)  5:16 PM pt continues to c/o nausea- will give reglan Labs Review Labs Reviewed  CBC WITH DIFFERENTIAL/PLATELET - Abnormal; Notable for the following:    Monocytes Relative 14 (*)    All other components within normal limits  COMPREHENSIVE METABOLIC PANEL - Abnormal; Notable for the following:  Glucose, Bld 144 (*)    Total Bilirubin 1.4 (*)    All other components within normal limits  CBG MONITORING, ED - Abnormal; Notable for the following:    Glucose-Capillary 133 (*)    All other components within normal limits  CBG MONITORING, ED - Abnormal; Notable for the following:    Glucose-Capillary 123 (*)    All other components within normal limits    Imaging Review No results found.   EKG Interpretation None      MDM   Final diagnoses:   Nausea vomiting and diarrhea    Pt presenting with vomiting and diarrhea.  His blood sugar is reassuring.  Labs are also reassuring- he feels improved after zofran, reglan- was still having some residual nausea- but no further vomiting in the ED. He was given phenergan just to help him further with his symptoms.  He has been able to keep down ginger ale in the ED.  Discharged with strict return precautions.  Pt agreeable with plan.    Ethelda Chick, MD 09/06/14 [phone]

## 2014-09-06 NOTE — ED Notes (Signed)
Pt c/o abd pain and nausea x 1 day, states he is not wanting to eat. Pt is diabetic. Pt has insulin pump.

## 2014-09-06 NOTE — Discharge Instructions (Signed)
Return to the ED with any concerns including vomiting and not able to keep down liquids or your medications, abdominal pain especially if it localizes to the right lower abdomen, fever or chills, and decreased urine output, decreased level of alertness or lethargy, or any other alarming symptoms.  °

## 2014-09-06 NOTE — ED Notes (Signed)
Pt is asking for something further for n/v.  Sts "I just threw up, like 4 more times.  It feels like it's still moving around in there."

## 2016-05-28 ENCOUNTER — Encounter (HOSPITAL_COMMUNITY): Payer: Self-pay | Admitting: Emergency Medicine

## 2016-05-28 ENCOUNTER — Emergency Department (HOSPITAL_COMMUNITY): Payer: 59

## 2016-05-28 ENCOUNTER — Emergency Department (HOSPITAL_COMMUNITY)
Admission: EM | Admit: 2016-05-28 | Discharge: 2016-05-29 | Disposition: A | Discharge status: Home / Self Care | Payer: 59 | Attending: Emergency Medicine | Admitting: Emergency Medicine

## 2016-05-28 DIAGNOSIS — E119 Type 2 diabetes mellitus without complications: Secondary | ICD-10-CM | POA: Insufficient documentation

## 2016-05-28 DIAGNOSIS — R079 Chest pain, unspecified: Secondary | ICD-10-CM | POA: Diagnosis not present

## 2016-05-28 DIAGNOSIS — Z7982 Long term (current) use of aspirin: Secondary | ICD-10-CM | POA: Diagnosis not present

## 2016-05-28 DIAGNOSIS — Z79899 Other long term (current) drug therapy: Secondary | ICD-10-CM | POA: Insufficient documentation

## 2016-05-28 DIAGNOSIS — Z794 Long term (current) use of insulin: Secondary | ICD-10-CM | POA: Diagnosis not present

## 2016-05-28 LAB — CBG MONITORING, ED: GLUCOSE-CAPILLARY: 143 mg/dL — AB (ref 65–99)

## 2016-05-28 LAB — I-STAT TROPONIN, ED
Troponin i, poc: 0 ng/mL (ref 0.00–0.08)
Troponin i, poc: 0.01 ng/mL (ref 0.00–0.08)

## 2016-05-28 LAB — CBC
HEMATOCRIT: 43.1 % (ref 39.0–52.0)
Hemoglobin: 14.4 g/dL (ref 13.0–17.0)
MCH: 30.2 pg (ref 26.0–34.0)
MCHC: 33.4 g/dL (ref 30.0–36.0)
MCV: 90.4 fL (ref 78.0–100.0)
Platelets: 205 10*3/uL (ref 150–400)
RBC: 4.77 MIL/uL (ref 4.22–5.81)
RDW: 12.6 % (ref 11.5–15.5)
WBC: 4.9 10*3/uL (ref 4.0–10.5)

## 2016-05-28 LAB — BASIC METABOLIC PANEL
Anion gap: 5 (ref 5–15)
BUN: 17 mg/dL (ref 6–20)
CHLORIDE: 107 mmol/L (ref 101–111)
CO2: 29 mmol/L (ref 22–32)
Calcium: 9 mg/dL (ref 8.9–10.3)
Creatinine, Ser: 0.8 mg/dL (ref 0.61–1.24)
GFR calc Af Amer: >60 mL/min (ref >60)
GFR calc non Af Amer: >60 mL/min (ref >60)
GLUCOSE: 110 mg/dL — AB (ref 65–99)
POTASSIUM: 4 mmol/L (ref 3.5–5.1)
Sodium: 141 mmol/L (ref 135–145)

## 2016-05-28 NOTE — ED Notes (Signed)
IV attempted by Henrene PastorJanet RN. Unsuccessful. This RN at to attempt.

## 2016-05-28 NOTE — ED Provider Notes (Signed)
[facility]-EMERGENCY DEPT Provider Note   CSN: [id number] Arrival date & time: 05/28/16  1648     History   Chief Complaint Chief Complaint  Patient presents with  . Chest Pain    HPI Steven Keith. is a 55 y.o. male.  HPI   Patient is a 55 year old male with a history of insulin dependent DM, hyperlipidemia who presents the emergency department with intermittent left sided chest pain for 2 weeks. Patient states these episodes last roughly 5 seconds or less and occur frequently throughout the day. He states yesterday the episodes of pain were more frequent. Pains are sharp, nonradiating, not associated with any activity or other associated symptoms. Patient has had only one episode of pain in the last few hours. Patient was seen by his PCP this morning and they did an EKG. They told him if the pain returns he needs to come to the emergency department. Patient denies history of hypertension. Patient states his mom died of a stroke at age 37. He also states she had heart stents placed prior to her stroke. Patient denies headache, dizziness, shortness of breath, visual changes, double pain, nausea, vomiting, swelling or pain in his calves, Recent travel, history of DVT or PE.  Past Medical History:  Diagnosis Date  . Diabetes mellitus without complication (HCC)   . Hyperlipidemia     There are no active problems to display for this patient.   Past Surgical History:  Procedure Laterality Date  . BACK SURGERY    . CARDIAC CATHETERIZATION  2007       Home Medications    Prior to Admission medications   Medication Sig Start Date End Date Taking? Authorizing Provider  aspirin EC 81 MG tablet Take 81 mg by mouth daily.   Yes Historical Provider, MD  atorvastatin (LIPITOR) 20 MG tablet Take 20 mg by mouth at bedtime.   Yes Historical Provider, MD  ibuprofen (ADVIL,MOTRIN) 200 MG tablet Take 400 mg by mouth daily.   Yes Historical Provider, MD  Insulin Human (INSULIN PUMP) 100  unit/ml SOLN Inject 1 each into the skin as directed. Replace insulin vial in pump every 3 days (patient uses Humalog)   Yes Historical Provider, MD  ondansetron (ZOFRAN) 4 MG tablet Take 1 tablet (4 mg total) by mouth every 6 (six) hours. Patient not taking: Reported on 05/28/2016 09/06/14   Jerelyn Scott, MD  promethazine (PHENERGAN) 25 MG suppository Place 1 suppository (25 mg total) rectally every 6 (six) hours as needed for nausea or vomiting. Patient not taking: Reported on 05/28/2016 09/06/14   Jerelyn Scott, MD  promethazine (PHENERGAN) 25 MG tablet Take 1 tablet (25 mg total) by mouth every 6 (six) hours as needed for nausea or vomiting. Patient not taking: Reported on 05/28/2016 09/06/14   Jerelyn Scott, MD    Family History History reviewed. No pertinent family history.  Social History Social History  Substance Use Topics  . Smoking status: Never Smoker  . Smokeless tobacco: Not on file  . Alcohol use No     Allergies   Review of patient's allergies indicates no known allergies.   Review of Systems Review of Systems  Constitutional: Negative for chills and fever.  HENT: Negative for sore throat and trouble swallowing.   Eyes: Negative for visual disturbance.  Respiratory: Negative for cough, chest tightness and shortness of breath.   Cardiovascular: Positive for chest pain. Negative for palpitations and leg swelling.  Gastrointestinal: Negative for abdominal pain, diarrhea, nausea and vomiting.  Genitourinary:  Negative for dysuria and hematuria.  Musculoskeletal: Negative for back pain, neck pain and neck stiffness.  Skin: Negative for rash.  Neurological: Negative for dizziness, syncope, weakness, light-headedness, numbness and headaches.     Physical Exam Updated Vital Signs BP 152/96   Pulse 64   Temp 98.2 F (36.8 C) (Oral)   Resp 15   Ht 6\' 4"  (1.93 m)   Wt 73 kg   SpO2 100%   BMI 19.60 kg/m   Physical Exam  Constitutional: He appears well-developed  and well-nourished. No distress.  HENT:  Head: Normocephalic and atraumatic.  Eyes: Conjunctivae are normal.  Neck: Normal range of motion. No JVD present. No tracheal deviation present.  Cardiovascular: Normal rate, regular rhythm and normal heart sounds.  Exam reveals no gallop and no friction rub.   No murmur heard. Pulses:      Radial pulses are 2+ on the right side, and 2+ on the left side.       Dorsalis pedis pulses are 2+ on the right side, and 2+ on the left side.  Pulmonary/Chest: Effort normal and breath sounds normal. No respiratory distress. He has no decreased breath sounds. He has no wheezes. He has no rhonchi. He has no rales. He exhibits no tenderness.  Abdominal: Soft. Bowel sounds are normal. He exhibits no distension. There is no tenderness.  Musculoskeletal: Normal range of motion. He exhibits no edema.  Lymphadenopathy:    He has no cervical adenopathy.  Neurological: He is alert. Coordination normal.  Skin: Skin is warm and dry. He is not diaphoretic.  Psychiatric: He has a normal mood and affect. His behavior is normal.  Nursing note and vitals reviewed.    ED Treatments / Results  Labs (all labs ordered are listed, but only abnormal results are displayed) Labs Reviewed  BASIC METABOLIC PANEL - Abnormal; Notable for the following:       Result Value   Glucose, Bld 110 (*)    All other components within normal limits  CBG MONITORING, ED - Abnormal; Notable for the following:    Glucose-Capillary 143 (*)    All other components within normal limits  CBC  I-STAT TROPOININ, ED  I-STAT TROPOININ, ED    EKG  EKG Interpretation  Date/Time:  Monday May 28 2016 17:02:46 EDT Ventricular Rate:  77 PR Interval:    QRS Duration: 97 QT Interval:  361 QTC Calculation: 409 R Axis:   -141 Text Interpretation:  Sinus rhythm Consider left ventricular hypertrophy Confirmed by Fayrene Fearing  MD, MARK ([id number]) on 05/28/2016 5:06:46 PM       Radiology Dg Chest 2  View  Result Date: 05/28/2016 CLINICAL DATA:  Left-sided chest pain for 2 days EXAM: CHEST  2 VIEW COMPARISON:  04/16/2010 FINDINGS: The heart size and mediastinal contours are within normal limits. Both lungs are clear. The visualized skeletal structures are unremarkable. IMPRESSION: No acute abnormality noted. Electronically Signed   By: Alcide Clever M.D.   On: 05/28/2016 18:05    Procedures Procedures (including critical care time)  Medications Ordered in ED Medications - No data to display   Initial Impression / Assessment and Plan / ED Course  I have reviewed the triage vital signs and the nursing notes.  Pertinent labs & imaging results that were available during my care of the patient were reviewed by me and considered in my medical decision making (see chart for details).  Clinical Course   Patient with intermittent chest pain. Heart score of 4. Patient  is to be discharged with recommendation to follow up with PCP and cardiology in the next week in regards to today's hospital visit. Patient states he'll call his PCP in the morning and determine if he needs a referral to cardiology. Patient was given cardiology contact info in discharge report. Instructed patient to get a stress test within the next 30 days.   Chest pain is not likely of cardiac or pulmonary etiology d/t presentation, VSS, no tracheal deviation, no JVD or new murmur, RRR, breath sounds equal bilaterally, EKG without acute abnormalities, negative troponin, negative delta troponin, and negative CXR. Pt has been advised to return to the ED is CP becomes exertional, associated with diaphoresis or nausea, radiates to left jaw/arm, worsens or becomes concerning in any way. Pt appears reliable for follow up and is agreeable to discharge.   Case has been discussed with Dr. Eudelia Bunchardama who agrees with the above plan to discharge.    Final Clinical Impressions(s) / ED Diagnoses   Final diagnoses:  Chest pain, unspecified type     New Prescriptions New Prescriptions   No medications on file     Jerre SimonJessica L Jayin Derousse, [place] 05/29/16 [id number]    Nira ConnPedro Eduardo Cardama, MD 05/29/16 1239

## 2016-05-28 NOTE — ED Triage Notes (Signed)
Pt states that he has had intermittent sharp L sided chest pain since Saturday. Saw PCP today and EKG looked fine. Hypertensive. Alert and oriented.

## 2016-05-28 NOTE — ED Notes (Signed)
Pt is wearing a insulin pump. States that his pump is asking to be calibrated. CBG has been ordered.

## 2016-05-28 NOTE — Progress Notes (Signed)
Patient listed as having [employer] insurance without a pcp.  EDCM spoke to patient at bedside.  Patient confirms his pcp is Dr. Johny BlamerWilliam Harris.  System updated.  No further EDCM needs at this time.

## 2016-05-29 NOTE — Discharge Instructions (Signed)
Follow-up with your primary care provider in the morning to see if you need a referral to cardiology. Follow-up with cardiology within one week for stress test and further cardiac workup. Return immediately to the emergency Department experience worsening chest pain or chest pain associated with sweating, pain radiating into your jaw, left arm, or back, dizziness, shortness of breath or any other concerning symptoms.

## 2016-08-16 DIAGNOSIS — Z794 Long term (current) use of insulin: Secondary | ICD-10-CM | POA: Diagnosis not present

## 2016-08-16 DIAGNOSIS — E103599 Type 1 diabetes mellitus with proliferative diabetic retinopathy without macular edema, unspecified eye: Secondary | ICD-10-CM | POA: Diagnosis not present

## 2016-08-17 DIAGNOSIS — Z794 Long term (current) use of insulin: Secondary | ICD-10-CM | POA: Diagnosis not present

## 2016-08-17 DIAGNOSIS — E1165 Type 2 diabetes mellitus with hyperglycemia: Secondary | ICD-10-CM | POA: Diagnosis not present

## 2016-08-20 DIAGNOSIS — R0982 Postnasal drip: Secondary | ICD-10-CM | POA: Diagnosis not present

## 2016-08-20 DIAGNOSIS — J029 Acute pharyngitis, unspecified: Secondary | ICD-10-CM | POA: Diagnosis not present

## 2016-09-25 DIAGNOSIS — R110 Nausea: Secondary | ICD-10-CM | POA: Diagnosis not present

## 2016-11-07 DIAGNOSIS — E1065 Type 1 diabetes mellitus with hyperglycemia: Secondary | ICD-10-CM | POA: Diagnosis not present

## 2016-11-23 DIAGNOSIS — E1065 Type 1 diabetes mellitus with hyperglycemia: Secondary | ICD-10-CM | POA: Diagnosis not present

## 2016-11-30 DIAGNOSIS — E103599 Type 1 diabetes mellitus with proliferative diabetic retinopathy without macular edema, unspecified eye: Secondary | ICD-10-CM | POA: Diagnosis not present

## 2016-11-30 DIAGNOSIS — E1065 Type 1 diabetes mellitus with hyperglycemia: Secondary | ICD-10-CM | POA: Diagnosis not present

## 2016-11-30 DIAGNOSIS — Z794 Long term (current) use of insulin: Secondary | ICD-10-CM | POA: Diagnosis not present

## 2017-02-27 DIAGNOSIS — M25511 Pain in right shoulder: Secondary | ICD-10-CM | POA: Diagnosis not present

## 2017-03-07 ENCOUNTER — Other Ambulatory Visit: Payer: Self-pay | Admitting: Family Medicine

## 2017-03-07 ENCOUNTER — Ambulatory Visit
Admission: RE | Admit: 2017-03-07 | Discharge: 2017-03-07 | Disposition: A | Discharge status: Home / Self Care | Payer: 59 | Source: Ambulatory Visit | Attending: Family Medicine | Admitting: Family Medicine

## 2017-03-07 DIAGNOSIS — M25511 Pain in right shoulder: Secondary | ICD-10-CM

## 2017-03-07 DIAGNOSIS — M19011 Primary osteoarthritis, right shoulder: Secondary | ICD-10-CM | POA: Diagnosis not present

## 2017-03-19 DIAGNOSIS — M7581 Other shoulder lesions, right shoulder: Secondary | ICD-10-CM | POA: Diagnosis not present

## 2017-03-21 DIAGNOSIS — E1065 Type 1 diabetes mellitus with hyperglycemia: Secondary | ICD-10-CM | POA: Diagnosis not present

## 2017-03-22 DIAGNOSIS — M7581 Other shoulder lesions, right shoulder: Secondary | ICD-10-CM | POA: Diagnosis not present

## 2017-03-25 DIAGNOSIS — E1065 Type 1 diabetes mellitus with hyperglycemia: Secondary | ICD-10-CM | POA: Diagnosis not present

## 2017-03-26 DIAGNOSIS — M7581 Other shoulder lesions, right shoulder: Secondary | ICD-10-CM | POA: Diagnosis not present

## 2017-03-28 DIAGNOSIS — M7581 Other shoulder lesions, right shoulder: Secondary | ICD-10-CM | POA: Diagnosis not present

## 2017-04-02 DIAGNOSIS — M7581 Other shoulder lesions, right shoulder: Secondary | ICD-10-CM | POA: Diagnosis not present

## 2017-04-04 DIAGNOSIS — M7581 Other shoulder lesions, right shoulder: Secondary | ICD-10-CM | POA: Diagnosis not present

## 2017-04-09 DIAGNOSIS — M7581 Other shoulder lesions, right shoulder: Secondary | ICD-10-CM | POA: Diagnosis not present

## 2017-04-12 DIAGNOSIS — M7581 Other shoulder lesions, right shoulder: Secondary | ICD-10-CM | POA: Diagnosis not present

## 2017-07-17 DIAGNOSIS — J069 Acute upper respiratory infection, unspecified: Secondary | ICD-10-CM | POA: Diagnosis not present

## 2017-07-17 DIAGNOSIS — I10 Essential (primary) hypertension: Secondary | ICD-10-CM | POA: Diagnosis not present

## 2017-07-30 DIAGNOSIS — E1065 Type 1 diabetes mellitus with hyperglycemia: Secondary | ICD-10-CM | POA: Diagnosis not present

## 2017-08-02 DIAGNOSIS — E1065 Type 1 diabetes mellitus with hyperglycemia: Secondary | ICD-10-CM | POA: Diagnosis not present

## 2017-09-11 DIAGNOSIS — M5412 Radiculopathy, cervical region: Secondary | ICD-10-CM | POA: Diagnosis not present

## 2017-10-08 DIAGNOSIS — R1310 Dysphagia, unspecified: Secondary | ICD-10-CM | POA: Diagnosis not present

## 2017-10-19 DIAGNOSIS — K0889 Other specified disorders of teeth and supporting structures: Secondary | ICD-10-CM | POA: Diagnosis not present

## 2017-12-19 DIAGNOSIS — Z0000 Encounter for general adult medical examination without abnormal findings: Secondary | ICD-10-CM | POA: Diagnosis not present

## 2017-12-19 DIAGNOSIS — E103599 Type 1 diabetes mellitus with proliferative diabetic retinopathy without macular edema, unspecified eye: Secondary | ICD-10-CM | POA: Diagnosis not present

## 2017-12-19 DIAGNOSIS — Z1211 Encounter for screening for malignant neoplasm of colon: Secondary | ICD-10-CM | POA: Diagnosis not present

## 2017-12-19 DIAGNOSIS — E785 Hyperlipidemia, unspecified: Secondary | ICD-10-CM | POA: Diagnosis not present

## 2017-12-24 DIAGNOSIS — E109 Type 1 diabetes mellitus without complications: Secondary | ICD-10-CM | POA: Diagnosis not present

## 2017-12-31 DIAGNOSIS — E785 Hyperlipidemia, unspecified: Secondary | ICD-10-CM | POA: Diagnosis not present

## 2017-12-31 DIAGNOSIS — Z1159 Encounter for screening for other viral diseases: Secondary | ICD-10-CM | POA: Diagnosis not present

## 2017-12-31 DIAGNOSIS — E103599 Type 1 diabetes mellitus with proliferative diabetic retinopathy without macular edema, unspecified eye: Secondary | ICD-10-CM | POA: Diagnosis not present

## 2018-01-22 ENCOUNTER — Encounter: Payer: Self-pay | Admitting: Family Medicine

## 2018-02-17 DIAGNOSIS — Z794 Long term (current) use of insulin: Secondary | ICD-10-CM | POA: Diagnosis not present

## 2018-02-17 DIAGNOSIS — I10 Essential (primary) hypertension: Secondary | ICD-10-CM | POA: Diagnosis not present

## 2018-02-17 DIAGNOSIS — E11319 Type 2 diabetes mellitus with unspecified diabetic retinopathy without macular edema: Secondary | ICD-10-CM | POA: Diagnosis not present

## 2018-02-21 DIAGNOSIS — E785 Hyperlipidemia, unspecified: Secondary | ICD-10-CM | POA: Diagnosis not present

## 2018-02-21 DIAGNOSIS — I10 Essential (primary) hypertension: Secondary | ICD-10-CM | POA: Diagnosis not present

## 2018-02-21 DIAGNOSIS — E1065 Type 1 diabetes mellitus with hyperglycemia: Secondary | ICD-10-CM | POA: Diagnosis not present

## 2018-03-31 ENCOUNTER — Emergency Department (HOSPITAL_COMMUNITY): Payer: 59

## 2018-03-31 ENCOUNTER — Other Ambulatory Visit: Payer: Self-pay

## 2018-03-31 ENCOUNTER — Encounter (HOSPITAL_COMMUNITY): Payer: Self-pay | Admitting: Emergency Medicine

## 2018-03-31 ENCOUNTER — Emergency Department (HOSPITAL_COMMUNITY)
Admission: EM | Admit: 2018-03-31 | Discharge: 2018-04-01 | Disposition: A | Discharge status: Home / Self Care | Payer: 59 | Attending: Emergency Medicine | Admitting: Emergency Medicine

## 2018-03-31 DIAGNOSIS — E119 Type 2 diabetes mellitus without complications: Secondary | ICD-10-CM | POA: Diagnosis not present

## 2018-03-31 DIAGNOSIS — Z79899 Other long term (current) drug therapy: Secondary | ICD-10-CM | POA: Diagnosis not present

## 2018-03-31 DIAGNOSIS — Z7982 Long term (current) use of aspirin: Secondary | ICD-10-CM | POA: Diagnosis not present

## 2018-03-31 DIAGNOSIS — R079 Chest pain, unspecified: Secondary | ICD-10-CM | POA: Diagnosis not present

## 2018-03-31 DIAGNOSIS — Z791 Long term (current) use of non-steroidal anti-inflammatories (NSAID): Secondary | ICD-10-CM | POA: Insufficient documentation

## 2018-03-31 DIAGNOSIS — Z794 Long term (current) use of insulin: Secondary | ICD-10-CM | POA: Diagnosis not present

## 2018-03-31 DIAGNOSIS — I10 Essential (primary) hypertension: Secondary | ICD-10-CM | POA: Diagnosis not present

## 2018-03-31 DIAGNOSIS — R072 Precordial pain: Secondary | ICD-10-CM | POA: Diagnosis not present

## 2018-03-31 DIAGNOSIS — R0789 Other chest pain: Secondary | ICD-10-CM | POA: Diagnosis not present

## 2018-03-31 LAB — I-STAT TROPONIN, ED: Troponin i, poc: 0.01 ng/mL (ref 0.00–0.08)

## 2018-03-31 LAB — BASIC METABOLIC PANEL
Anion gap: 7 (ref 5–15)
BUN: 11 mg/dL (ref 6–20)
CALCIUM: 9.3 mg/dL (ref 8.9–10.3)
CO2: 31 mmol/L (ref 22–32)
CREATININE: 1 mg/dL (ref 0.61–1.24)
Chloride: 103 mmol/L (ref 98–111)
GFR calc non Af Amer: >60 mL/min (ref >60)
Glucose, Bld: 99 mg/dL (ref 70–99)
Potassium: 4.1 mmol/L (ref 3.5–5.1)
SODIUM: 141 mmol/L (ref 135–145)

## 2018-03-31 LAB — CBC
HCT: 43.3 % (ref 39.0–52.0)
Hemoglobin: 13.6 g/dL (ref 13.0–17.0)
MCH: 29.4 pg (ref 26.0–34.0)
MCHC: 31.4 g/dL (ref 30.0–36.0)
MCV: 93.5 fL (ref 78.0–100.0)
PLATELETS: 199 10*3/uL (ref 150–400)
RBC: 4.63 MIL/uL (ref 4.22–5.81)
RDW: 12 % (ref 11.5–15.5)
WBC: 4.4 10*3/uL (ref 4.0–10.5)

## 2018-03-31 NOTE — ED Provider Notes (Signed)
[facility] EMERGENCY DEPARTMENT Provider Note   CSN: [id number] Arrival date & time: 03/31/18  1903     History   Chief Complaint Chief Complaint  Patient presents with  . Chest Pain    HPI Alias Steven Sr. is a 57 y.o. male.  Patient presents with several complaints.  Patient reports that he has been experiencing intermittent left-sided chest pain for 1 week.  He reports that he will suddenly get a sharp pain in the left chest that will last for a few seconds and then resolved.  He can happen at any time, is not related to exertion or movement.  No shortness of breath or palpitations.  He reports that similar episodes occurred 2 years ago and he saw cardiology.  He had a stress test and was told everything was okay.  Patient is also complaining of pain and tiredness in his jaw.  This is independent of the chest pain.  He reports that when he eats his breakfast he feels like he is tired and cannot chew.  He has to stop for a while and then start over again.  It does not usually happen later in the day, only in the mornings.  No vision change, no headache     Past Medical History:  Diagnosis Date  . Diabetes mellitus without complication (HCC)   . Hyperlipidemia     There are no active problems to display for this patient.   Past Surgical History:  Procedure Laterality Date  . BACK SURGERY    . CARDIAC CATHETERIZATION  2007        Home Medications    Prior to Admission medications   Medication Sig Start Date End Date Taking? Authorizing Provider  aspirin EC 81 MG tablet Take 81 mg by mouth daily.   Yes [provider]  atorvastatin (LIPITOR) 20 MG tablet Take 20 mg by mouth at bedtime.   Yes [provider]  hydrochlorothiazide (HYDRODIURIL) 25 MG tablet Take 12.5 mg by mouth daily.   Yes [provider]  ibuprofen (ADVIL,MOTRIN) 200 MG tablet Take 400 mg by mouth every morning.    Yes [provider]    Insulin Human (INSULIN PUMP) 100 unit/ml SOLN Inject 1 each into the skin as directed. Replace insulin vial in pump every 3 days (patient uses Humalog)   Yes [provider]  valsartan (DIOVAN) 160 MG tablet Take 320 mg by mouth daily.   Yes [provider]  ondansetron (ZOFRAN) 4 MG tablet Take 1 tablet (4 mg total) by mouth every 6 (six) hours. Patient not taking: Reported on 05/28/2016 09/06/14   Phillis Haggis, MD  pantoprazole (PROTONIX) 40 MG tablet Take 1 tablet (40 mg total) by mouth daily. 04/01/18   Gilda Crease, MD  promethazine (PHENERGAN) 25 MG suppository Place 1 suppository (25 mg total) rectally every 6 (six) hours as needed for nausea or vomiting. Patient not taking: Reported on 05/28/2016 09/06/14   Phillis Haggis, MD  promethazine (PHENERGAN) 25 MG tablet Take 1 tablet (25 mg total) by mouth every 6 (six) hours as needed for nausea or vomiting. Patient not taking: Reported on 05/28/2016 09/06/14   Mabe, Latanya Maudlin, MD    Family History History reviewed. No pertinent family history.  Social History Social History   Tobacco Use  . Smoking status: Never Smoker  Substance Use Topics  . Alcohol use: No  . Drug use: No     Allergies   Patient has  no known allergies.   Review of Systems Review of Systems  Cardiovascular: Positive for chest pain.  All other systems reviewed and are negative.    Physical Exam Updated Vital Signs BP (!) 136/91   Pulse (!) 58   Temp 98.1 F (36.7 C) (Oral)   Resp 15   SpO2 98%   Physical Exam  Constitutional: He is oriented to person, place, and time. He appears well-developed and well-nourished. No distress.  HENT:  Head: Normocephalic and atraumatic.  Right Ear: Hearing normal.  Left Ear: Hearing normal.  Nose: Nose normal.  Mouth/Throat: Oropharynx is clear and moist and mucous membranes are normal.  Eyes: Pupils are equal, round, and reactive to light. Conjunctivae and EOM are normal.  Neck:  Normal range of motion. Neck supple.  Cardiovascular: Regular rhythm, S1 normal and S2 normal. Exam reveals no gallop and no friction rub.  No murmur heard. Pulmonary/Chest: Effort normal and breath sounds normal. No respiratory distress. He exhibits no tenderness.  Abdominal: Soft. Normal appearance and bowel sounds are normal. There is no hepatosplenomegaly. There is no tenderness. There is no rebound, no guarding, no tenderness at McBurney's point and negative Murphy's sign. No hernia.  Musculoskeletal: Normal range of motion.  Neurological: He is alert and oriented to person, place, and time. He has normal strength. No cranial nerve deficit or sensory deficit. Coordination normal. GCS eye subscore is 4. GCS verbal subscore is 5. GCS motor subscore is 6.  Skin: Skin is warm, dry and intact. No rash noted. No cyanosis.  Psychiatric: He has a normal mood and affect. His speech is normal and behavior is normal. Thought content normal.  Nursing note and vitals reviewed.    ED Treatments / Results  Labs (all labs ordered are listed, but only abnormal results are displayed) Labs Reviewed  BASIC METABOLIC PANEL  CBC  SEDIMENTATION RATE  I-STAT TROPONIN, ED  I-STAT TROPONIN, ED    EKG EKG Interpretation  Date/Time:  Monday March 31 2018 19:11:00 EDT Ventricular Rate:  69 PR Interval:  198 QRS Duration: 114 QT Interval:  364 QTC Calculation: 390 R Axis:   159 Text Interpretation:  Normal sinus rhythm Right axis deviation Pulmonary disease pattern Abnormal ECG No significant change since last tracing Confirmed by Gilda CreasePollina, Christopher J [phone]) on 03/31/2018 11:20:41 PM   Radiology Dg Chest 2 View  Result Date: 03/31/2018 CLINICAL DATA:  Chest pain EXAM: CHEST - 2 VIEW COMPARISON:  05/28/2016 FINDINGS: The heart size and mediastinal contours are within normal limits. Both lungs are clear. The visualized skeletal structures are unremarkable. IMPRESSION: No active cardiopulmonary  disease. Electronically Signed   By: Jasmine PangKim  Fujinaga M.D.   On: 03/31/2018 20:12    Procedures Procedures (including critical care time)  Medications Ordered in ED Medications - No data to display   Initial Impression / Assessment and Plan / ED Course  I have reviewed the triage vital signs and the nursing notes.  Pertinent labs & imaging results that were available during my care of the patient were reviewed by me and considered in my medical decision making (see chart for details).     Patient presents to the ER with multiple complaints.  He is experiencing very atypical chest pain.  He describes a sharp pain that hits him in the left chest intermittently and lasts for a few seconds and then resolves.  He has had this before and has had a full cardiac work-up for it, was cleared by cardiology.  His work-up  today is unremarkable, this is not cardiac in nature.  Can follow-up with primary care but does not need any further significant work-ups for this.  Patient also complaining of feeling like his jaw is tired in the mornings.  He has trouble eating his breakfast because he tires his jaw to chew and he has to stop and rest for a while.  Is not actually pain, but this was felt to be possibly claudication equivalent.  His sed rate, however, is 1.  This essentially rules out temporal arteritis.  Additionally he has not had any headache or vision change.  He also has some sensation of feeling like things are catching in his throats, will initiate him on reflux medication and can follow-up with ENT for further oropharyngeal examination.  Final Clinical Impressions(s) / ED Diagnoses   Final diagnoses:  Chest pain, unspecified type    ED Discharge Orders         Ordered    pantoprazole (PROTONIX) 40 MG tablet  Daily     04/01/18 0250           Gilda CreasePollina, Christopher J, MD 04/01/18 [phone]

## 2018-03-31 NOTE — ED Triage Notes (Signed)
Pt reports CP on L side radiating to L neck. Pt also reports jaw pain when chewing. [facility] and diagnosed with "phase 1 block."

## 2018-03-31 NOTE — ED Provider Notes (Signed)
Patient placed in Quick Look pathway, seen and evaluated   Chief Complaint: Chest pain, Jaw tired, Neck discomfort  HPI:   57 year old male presents with intermittent, sharp left sided chest pain for the past week. It became worse when he was pushing something at work today so went to a walk in clinic. 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 He denies fever, cough, SOB, abdominal pain, leg swelling. He has seen cardiology a couple years ago and had a ST done which was normal.  ROS: +chest pain  Physical Exam:   Gen: No distress  Neuro: Awake and Alert  Skin: Warm    Focused Exam:    Initiation of care has begun. The patient has been counseled on the process, plan, and necessity for staying for the completion/evaluation, and the remainder of the medical screening examination    Bethel BornGekas, Novis League Marie, PA-C 03/31/18 1954    Benjiman CorePickering, Nathan, MD 03/31/18 [phone]

## 2018-04-01 LAB — I-STAT TROPONIN, ED: TROPONIN I, POC: 0 ng/mL (ref 0.00–0.08)

## 2018-04-01 LAB — SEDIMENTATION RATE: SED RATE: 1 mm/h (ref 0–16)

## 2018-04-01 MED ORDER — PANTOPRAZOLE SODIUM 40 MG PO TBEC: 40.0000 mg | DELAYED_RELEASE_TABLET | Freq: Every day | ORAL | 3 refills | Status: DC

## 2018-04-02 DIAGNOSIS — Z794 Long term (current) use of insulin: Secondary | ICD-10-CM | POA: Diagnosis not present

## 2018-04-02 DIAGNOSIS — E1065 Type 1 diabetes mellitus with hyperglycemia: Secondary | ICD-10-CM | POA: Diagnosis not present

## 2018-04-25 DIAGNOSIS — R1310 Dysphagia, unspecified: Secondary | ICD-10-CM | POA: Diagnosis not present

## 2018-04-25 DIAGNOSIS — R070 Pain in throat: Secondary | ICD-10-CM | POA: Diagnosis not present

## 2018-04-30 DIAGNOSIS — L723 Sebaceous cyst: Secondary | ICD-10-CM | POA: Diagnosis not present

## 2018-06-09 DIAGNOSIS — Z1211 Encounter for screening for malignant neoplasm of colon: Secondary | ICD-10-CM | POA: Diagnosis not present

## 2018-06-09 DIAGNOSIS — K648 Other hemorrhoids: Secondary | ICD-10-CM | POA: Diagnosis not present

## 2018-06-16 DIAGNOSIS — Z794 Long term (current) use of insulin: Secondary | ICD-10-CM | POA: Diagnosis not present

## 2018-06-16 DIAGNOSIS — E11319 Type 2 diabetes mellitus with unspecified diabetic retinopathy without macular edema: Secondary | ICD-10-CM | POA: Diagnosis not present

## 2018-06-16 DIAGNOSIS — I10 Essential (primary) hypertension: Secondary | ICD-10-CM | POA: Diagnosis not present

## 2018-06-27 ENCOUNTER — Ambulatory Visit (HOSPITAL_COMMUNITY)
Admission: EM | Admit: 2018-06-27 | Discharge: 2018-06-27 | Disposition: A | Discharge status: Home / Self Care | Payer: 59 | Attending: Internal Medicine | Admitting: Internal Medicine

## 2018-06-27 ENCOUNTER — Encounter (HOSPITAL_COMMUNITY): Payer: Self-pay | Admitting: Emergency Medicine

## 2018-06-27 DIAGNOSIS — R1310 Dysphagia, unspecified: Secondary | ICD-10-CM

## 2018-06-27 MED ORDER — PANTOPRAZOLE SODIUM 40 MG PO TBEC: 40.0000 mg | DELAYED_RELEASE_TABLET | Freq: Every day | ORAL | 0 refills | Status: DC

## 2018-06-27 MED ORDER — DEXAMETHASONE SODIUM PHOSPHATE 10 MG/ML IJ SOLN
INTRAMUSCULAR | Status: AC
  Filled 2018-06-27: qty 1

## 2018-06-27 MED ORDER — DEXAMETHASONE SODIUM PHOSPHATE 10 MG/ML IJ SOLN
10.0000 mg | Freq: Once | INTRAMUSCULAR | Status: AC
  Administered 2018-06-27: 10 mg

## 2018-06-27 NOTE — Discharge Instructions (Signed)
I would recommend restarting daily protonix as previously prescribed.  We will try today's one time dose of steroid to see if this provides any improvement of symptoms.  Please monitor your blood sugar as this medication may increase your blood sugar.  Please follow up with ENT for further evaluation and treatment, I would call again to see if they have any cancellations etc to be seen sooner.  Please go to the ER if unable to swallow or begin vomiting.

## 2018-06-27 NOTE — ED Provider Notes (Signed)
[facility]    CSN: [id number] Arrival date & time: 06/27/18  1324     History   Chief Complaint Chief Complaint  Patient presents with  . Throat Problem    HPI Steven Nigg Sr. is a 57 y.o. male.   [name] presents with persistent symptoms of feeling like he needs to keep swallowing. No pain to throat or with swallowing. No choking or regurgitation. This has been ongoing since august of this year. Was scheduled to see ENT but accidentally missed appointment. Feels some pain with chewing at times and occasional bilateral lateral neck pain. No complications with eating however. He has had a slight cough over the past few days, but no congestion. No nausea or vomiting. Doesn't smoke and never has been a smoker. States his father had lung cancer and grandfather also with cancer- unknown type. Has been seen for this in the past, was started on pantoprazole. No longer taking, but feels it did not necessarily help. He endorses that he worries a lot. Saw his dentist last month with xrays completed, no acute findings. Hx of Dm, uses insulin pump. Blood sugars have been stable.     ROS per HPI.      Past Medical History:  Diagnosis Date  . Diabetes mellitus without complication (HCC)   . Hyperlipidemia     There are no active problems to display for this patient.   Past Surgical History:  Procedure Laterality Date  . BACK SURGERY    . CARDIAC CATHETERIZATION  2007       Home Medications    Prior to Admission medications   Medication Sig Start Date End Date Taking? Authorizing Provider  aspirin EC 81 MG tablet Take 81 mg by mouth daily.    [provider]  atorvastatin (LIPITOR) 20 MG tablet Take 20 mg by mouth at bedtime.    [provider]  hydrochlorothiazide (HYDRODIURIL) 25 MG tablet Take 12.5 mg by mouth daily.    [provider]  ibuprofen (ADVIL,MOTRIN) 200 MG tablet Take 400 mg by mouth every morning.     [provider]  Insulin Human (INSULIN PUMP) 100 unit/ml SOLN Inject 1 each into the skin as directed. Replace insulin vial in pump every 3 days (patient uses Humalog)    [provider]  pantoprazole (PROTONIX) 40 MG tablet Take 1 tablet (40 mg total) by mouth daily. 06/27/18   Georgetta HaberBurky, Iziah Cates B, NP  valsartan (DIOVAN) 160 MG tablet Take 320 mg by mouth daily.    [provider]    Family History No family history on file.  Social History Social History   Tobacco Use  . Smoking status: Never Smoker  Substance Use Topics  . Alcohol use: No  . Drug use: No     Allergies   Patient has no known allergies.   Review of Systems Review of Systems   Physical Exam Triage Vital Signs ED Triage Vitals  Enc Vitals Group     BP 06/27/18 1344 139/70     Pulse Rate 06/27/18 1344 98     Resp 06/27/18 1344 18     Temp 06/27/18 1344 97.9 F (36.6 C)     Temp Source 06/27/18 1344 Oral     SpO2 06/27/18 1344 100 %     Weight --      Height --      Head Circumference --      Peak Flow --      Pain Score  06/27/18 1350 3     Pain Loc --      Pain Edu? --      Excl. in GC? --    No data found.  Updated Vital Signs BP 139/70 (BP Location: Left Arm)   Pulse 98   Temp 97.9 F (36.6 C) (Oral)   Resp 18   SpO2 100%    Physical Exam  Constitutional: He is oriented to person, place, and time. He appears well-developed and well-nourished.  HENT:  Head: Normocephalic and atraumatic.  Right Ear: External ear normal.  Left Ear: External ear normal.  Nose: Nose normal.  Mouth/Throat: Uvula is midline, oropharynx is clear and moist and mucous membranes are normal. No trismus in the jaw. No dental abscesses or uvula swelling.  Patient speaking clearly, swallowing secretions without difficulty   Cardiovascular: Normal rate and regular rhythm.  Pulmonary/Chest: Effort normal and breath sounds normal.  Lymphadenopathy:    He has no cervical adenopathy.  Neurological:  He is alert and oriented to person, place, and time.  Skin: Skin is warm and dry.     UC Treatments / Results  Labs (all labs ordered are listed, but only abnormal results are displayed) Labs Reviewed - No data to display  EKG None  Radiology No results found.  Procedures Procedures (including critical care time)  Medications Ordered in UC Medications  dexamethasone (DECADRON) injection 10 mg (has no administration in time range)    Initial Impression / Assessment and Plan / UC Course  I have reviewed the triage vital signs and the nursing notes.  Pertinent labs & imaging results that were available during my care of the patient were reviewed by me and considered in my medical decision making (see chart for details).     No acute findings on exam. Oral cavity WNL, no redness or swelling. Swallowing secretions without difficulty. No choking. Ongoing symptoms for months now. Recommend restart pantoprazole. Decadron x1 provided today to see if antiinflammatory component provides any relief. Encouraged blood sugar monitoring. Recommend evaluation with ENT as scheduled as may likely need further evaluation and possible scope. Return precautions provided. Patient verbalized understanding and agreeable to plan.  Ambulatory out of clinic without difficulty.    Final Clinical Impressions(s) / UC Diagnoses   Final diagnoses:  Dysphagia, unspecified type     Discharge Instructions     I would recommend restarting daily protonix as previously prescribed.  We will try today's one time dose of steroid to see if this provides any improvement of symptoms.  Please monitor your blood sugar as this medication may increase your blood sugar.  Please follow up with ENT for further evaluation and treatment, I would call again to see if they have any cancellations etc to be seen sooner.  Please go to the ER if unable to swallow or begin vomiting.    ED Prescriptions    Medication Sig  Dispense Auth. Provider   pantoprazole (PROTONIX) 40 MG tablet Take 1 tablet (40 mg total) by mouth daily. 30 tablet Georgetta Haber, NP     Controlled Substance Prescriptions [place] Controlled Substance Registry consulted? Not Applicable   Georgetta Haber, NP 06/27/18 1423

## 2018-06-27 NOTE — ED Triage Notes (Signed)
Pt c/o sensation in his throat where he feels like he keeps having to swallow. Pt had appt with ENT but missed his appt and his next one is in Dec. Pt states sometimes he feels pain in his neck. Pt able to swallow secretions.

## 2018-07-22 DIAGNOSIS — R070 Pain in throat: Secondary | ICD-10-CM | POA: Diagnosis not present

## 2018-07-22 DIAGNOSIS — K219 Gastro-esophageal reflux disease without esophagitis: Secondary | ICD-10-CM | POA: Diagnosis not present

## 2018-07-23 ENCOUNTER — Other Ambulatory Visit (INDEPENDENT_AMBULATORY_CARE_PROVIDER_SITE_OTHER): Payer: Self-pay | Admitting: Otolaryngology

## 2018-07-23 DIAGNOSIS — R1310 Dysphagia, unspecified: Secondary | ICD-10-CM

## 2018-07-28 ENCOUNTER — Ambulatory Visit (HOSPITAL_COMMUNITY)
Admission: RE | Admit: 2018-07-28 | Discharge: 2018-07-28 | Disposition: A | Discharge status: Home / Self Care | Payer: 59 | Source: Ambulatory Visit | Attending: Otolaryngology | Admitting: Otolaryngology

## 2018-07-28 DIAGNOSIS — R1310 Dysphagia, unspecified: Secondary | ICD-10-CM | POA: Insufficient documentation

## 2018-08-26 DIAGNOSIS — I10 Essential (primary) hypertension: Secondary | ICD-10-CM | POA: Diagnosis not present

## 2018-08-26 DIAGNOSIS — E1065 Type 1 diabetes mellitus with hyperglycemia: Secondary | ICD-10-CM | POA: Diagnosis not present

## 2018-08-26 DIAGNOSIS — E785 Hyperlipidemia, unspecified: Secondary | ICD-10-CM | POA: Diagnosis not present

## 2018-08-27 DIAGNOSIS — R070 Pain in throat: Secondary | ICD-10-CM | POA: Diagnosis not present

## 2018-08-27 DIAGNOSIS — K219 Gastro-esophageal reflux disease without esophagitis: Secondary | ICD-10-CM | POA: Diagnosis not present

## 2018-09-25 DIAGNOSIS — L720 Epidermal cyst: Secondary | ICD-10-CM | POA: Diagnosis not present

## 2018-09-25 DIAGNOSIS — L02213 Cutaneous abscess of chest wall: Secondary | ICD-10-CM | POA: Diagnosis not present

## 2018-10-03 ENCOUNTER — Other Ambulatory Visit: Payer: Self-pay | Admitting: Otolaryngology

## 2018-10-03 DIAGNOSIS — Z794 Long term (current) use of insulin: Secondary | ICD-10-CM | POA: Diagnosis not present

## 2018-10-03 DIAGNOSIS — I10 Essential (primary) hypertension: Secondary | ICD-10-CM | POA: Diagnosis not present

## 2018-10-03 DIAGNOSIS — E11319 Type 2 diabetes mellitus with unspecified diabetic retinopathy without macular edema: Secondary | ICD-10-CM | POA: Diagnosis not present

## 2018-10-03 DIAGNOSIS — R070 Pain in throat: Secondary | ICD-10-CM

## 2018-10-28 DIAGNOSIS — E1065 Type 1 diabetes mellitus with hyperglycemia: Secondary | ICD-10-CM | POA: Diagnosis not present

## 2018-10-29 DIAGNOSIS — R002 Palpitations: Secondary | ICD-10-CM | POA: Diagnosis not present

## 2018-10-29 DIAGNOSIS — Z79899 Other long term (current) drug therapy: Secondary | ICD-10-CM | POA: Diagnosis not present

## 2018-10-29 DIAGNOSIS — R072 Precordial pain: Secondary | ICD-10-CM | POA: Diagnosis not present

## 2018-10-29 DIAGNOSIS — I4891 Unspecified atrial fibrillation: Secondary | ICD-10-CM | POA: Diagnosis not present

## 2018-10-31 DIAGNOSIS — R9431 Abnormal electrocardiogram [ECG] [EKG]: Secondary | ICD-10-CM | POA: Diagnosis not present

## 2018-10-31 DIAGNOSIS — I10 Essential (primary) hypertension: Secondary | ICD-10-CM | POA: Diagnosis not present

## 2018-10-31 DIAGNOSIS — Z7901 Long term (current) use of anticoagulants: Secondary | ICD-10-CM | POA: Diagnosis not present

## 2018-10-31 DIAGNOSIS — I4891 Unspecified atrial fibrillation: Secondary | ICD-10-CM | POA: Diagnosis not present

## 2018-11-03 DIAGNOSIS — R05 Cough: Secondary | ICD-10-CM | POA: Diagnosis not present

## 2018-11-14 DIAGNOSIS — I10 Essential (primary) hypertension: Secondary | ICD-10-CM | POA: Diagnosis not present

## 2018-11-14 DIAGNOSIS — I4891 Unspecified atrial fibrillation: Secondary | ICD-10-CM | POA: Diagnosis not present

## 2018-11-14 DIAGNOSIS — Z7901 Long term (current) use of anticoagulants: Secondary | ICD-10-CM | POA: Diagnosis not present

## 2018-11-26 DIAGNOSIS — N529 Male erectile dysfunction, unspecified: Secondary | ICD-10-CM | POA: Diagnosis not present

## 2018-11-27 DIAGNOSIS — R079 Chest pain, unspecified: Secondary | ICD-10-CM | POA: Diagnosis not present

## 2018-12-04 DIAGNOSIS — R0989 Other specified symptoms and signs involving the circulatory and respiratory systems: Secondary | ICD-10-CM | POA: Diagnosis not present

## 2018-12-04 DIAGNOSIS — R079 Chest pain, unspecified: Secondary | ICD-10-CM | POA: Diagnosis not present

## 2019-09-21 ENCOUNTER — Emergency Department (HOSPITAL_COMMUNITY)
Admission: EM | Admit: 2019-09-21 | Discharge: 2019-09-22 | Disposition: A | Discharge status: Home / Self Care | Payer: 59 | Attending: Emergency Medicine | Admitting: Emergency Medicine

## 2019-09-21 ENCOUNTER — Emergency Department (HOSPITAL_COMMUNITY): Payer: 59

## 2019-09-21 DIAGNOSIS — I4891 Unspecified atrial fibrillation: Secondary | ICD-10-CM

## 2019-09-21 DIAGNOSIS — Z79899 Other long term (current) drug therapy: Secondary | ICD-10-CM | POA: Diagnosis not present

## 2019-09-21 DIAGNOSIS — Z794 Long term (current) use of insulin: Secondary | ICD-10-CM | POA: Diagnosis not present

## 2019-09-21 DIAGNOSIS — E119 Type 2 diabetes mellitus without complications: Secondary | ICD-10-CM | POA: Insufficient documentation

## 2019-09-21 DIAGNOSIS — I480 Paroxysmal atrial fibrillation: Secondary | ICD-10-CM | POA: Insufficient documentation

## 2019-09-21 DIAGNOSIS — Z7901 Long term (current) use of anticoagulants: Secondary | ICD-10-CM | POA: Insufficient documentation

## 2019-09-21 DIAGNOSIS — R000 Tachycardia, unspecified: Secondary | ICD-10-CM | POA: Diagnosis present

## 2019-09-21 LAB — BASIC METABOLIC PANEL
Anion gap: 10 (ref 5–15)
BUN: 20 mg/dL (ref 6–20)
CO2: 27 mmol/L (ref 22–32)
Calcium: 8.8 mg/dL — ABNORMAL LOW (ref 8.9–10.3)
Chloride: 104 mmol/L (ref 98–111)
Creatinine, Ser: 1.05 mg/dL (ref 0.61–1.24)
GFR calc Af Amer: >60 mL/min (ref >60)
GFR calc non Af Amer: >60 mL/min (ref >60)
Glucose, Bld: 118 mg/dL — ABNORMAL HIGH (ref 70–99)
Potassium: 4.1 mmol/L (ref 3.5–5.1)
Sodium: 141 mmol/L (ref 135–145)

## 2019-09-21 LAB — T4, FREE: Free T4: 1.02 ng/dL (ref 0.61–1.12)

## 2019-09-21 LAB — MAGNESIUM: Magnesium: 2 mg/dL (ref 1.7–2.4)

## 2019-09-21 LAB — TSH: TSH: 1.298 u[IU]/mL (ref 0.350–4.500)

## 2019-09-21 LAB — CBC
HCT: 43.4 % (ref 39.0–52.0)
Hemoglobin: 13.9 g/dL (ref 13.0–17.0)
MCH: 30.3 pg (ref 26.0–34.0)
MCHC: 32 g/dL (ref 30.0–36.0)
MCV: 94.6 fL (ref 80.0–100.0)
Platelets: 181 10*3/uL (ref 150–400)
RBC: 4.59 MIL/uL (ref 4.22–5.81)
RDW: 12.9 % (ref 11.5–15.5)
WBC: 5.9 10*3/uL (ref 4.0–10.5)
nRBC: 0 % (ref 0.0–0.2)

## 2019-09-21 LAB — PROTIME-INR
INR: 1.1 (ref 0.8–1.2)
Prothrombin Time: 14.4 seconds (ref 11.4–15.2)

## 2019-09-21 MED ORDER — DILTIAZEM LOAD VIA INFUSION
20.0000 mg | Freq: Once | INTRAVENOUS | Status: AC
  Administered 2019-09-21: 22:00:00 20 mg via INTRAVENOUS
  Filled 2019-09-21: qty 20

## 2019-09-21 MED ORDER — DILTIAZEM HCL-DEXTROSE 125-5 MG/125ML-% IV SOLN (PREMIX)
5.0000 mg/h | INTRAVENOUS | Status: DC
  Administered 2019-09-21: 5 mg/h via INTRAVENOUS
  Filled 2019-09-21: qty 125

## 2019-09-21 MED ORDER — SODIUM CHLORIDE 0.9% FLUSH: 3.0000 mL | Freq: Once | INTRAVENOUS | Status: DC

## 2019-09-21 MED ORDER — ETOMIDATE 2 MG/ML IV SOLN
7.0000 mg | Freq: Once | INTRAVENOUS | Status: AC
  Administered 2019-09-22: 7 mg via INTRAVENOUS
  Filled 2019-09-21: qty 10

## 2019-09-21 NOTE — ED Provider Notes (Signed)
[facility] EMERGENCY DEPARTMENT Provider Note   CSN: [id number] Arrival date & time: 09/21/19  2029     History Chief Complaint  Patient presents with  . Tachycardia    A fib RVR. Known hx. rate 120-140. Asymptomatic.     Steven Quintin Sr. is a 59 y.o. male.  The history is provided by the patient and medical records.  Palpitations Palpitations quality:  Irregular Onset quality:  Sudden Duration:  1 day Timing:  Constant Progression:  Unchanged Chronicity:  Recurrent Relieved by:  Nothing Exacerbated by: ambulation. Ineffective treatments: Cardizem. Associated symptoms: malaise/fatigue   Associated symptoms: no chest pain, no chest pressure, no cough, no dizziness, no leg pain, no lower extremity edema, no nausea, no shortness of breath, no syncope and no vomiting   Risk factors: hx of atrial fibrillation        Past Medical History:  Diagnosis Date  . Diabetes mellitus without complication (HCC)   . Hyperlipidemia     There are no problems to display for this patient.   Past Surgical History:  Procedure Laterality Date  . BACK SURGERY    . CARDIAC CATHETERIZATION  2007       No family history on file.  Social History   Tobacco Use  . Smoking status: Never Smoker  Substance Use Topics  . Alcohol use: No  . Drug use: No    Home Medications Prior to Admission medications   Medication Sig Start Date End Date Taking? Authorizing Provider  apixaban (ELIQUIS) 5 MG TABS tablet Take 5 mg by mouth 2 (two) times daily.   Yes [provider]  atorvastatin (LIPITOR) 20 MG tablet Take 20 mg by mouth at bedtime.   Yes [provider]  diltiazem (DILACOR XR) 120 MG 24 hr capsule Take 120 mg by mouth daily.   Yes [provider]  hydrochlorothiazide (HYDRODIURIL) 12.5 MG tablet Take 12.5 mg by mouth daily.   Yes [provider]  Insulin Human (INSULIN PUMP) 100 unit/ml SOLN Inject 1 each into the skin as  directed. Replace insulin vial in pump every 3 days (patient uses Humalog)   Yes [provider]  pantoprazole (PROTONIX) 40 MG tablet Take 1 tablet (40 mg total) by mouth daily. Patient not taking: Reported on 09/21/2019 06/27/18   Georgetta Haber, NP    Allergies    Patient has no known allergies.  Review of Systems   Review of Systems  Constitutional: Positive for malaise/fatigue. Negative for chills and fever.  Respiratory: Negative for cough and shortness of breath.   Cardiovascular: Positive for palpitations. Negative for chest pain and syncope.  Gastrointestinal: Negative for nausea and vomiting.  Neurological: Negative for dizziness.  All other systems reviewed and are negative.   Physical Exam Updated Vital Signs BP 116/89   Pulse 67   Temp 98.5 F (36.9 C) (Oral)   Resp 14   SpO2 99%   Physical Exam Vitals and nursing note reviewed.  Constitutional:      Appearance: He is well-developed.  HENT:     Head: Normocephalic and atraumatic.     Mouth/Throat:     Mouth: Mucous membranes are moist.     Pharynx: Oropharynx is clear.  Eyes:     Conjunctiva/sclera: Conjunctivae normal.  Cardiovascular:     Rate and Rhythm: Tachycardia present. Rhythm irregular.     Pulses: Normal pulses.     Heart sounds: No murmur.  Pulmonary:     Effort: Pulmonary effort  is normal. No respiratory distress.     Breath sounds: Normal breath sounds.  Abdominal:     Palpations: Abdomen is soft.     Tenderness: There is no abdominal tenderness.  Musculoskeletal:     Cervical back: Neck supple.  Skin:    General: Skin is warm and dry.  Neurological:     Mental Status: He is alert.     ED Results / Procedures / Treatments   Labs (all labs ordered are listed, but only abnormal results are displayed) Labs Reviewed  BASIC METABOLIC PANEL - Abnormal; Notable for the following components:      Result Value   Glucose, Bld 118 (*)    Calcium 8.8 (*)    All other components  within normal limits  CBC  PROTIME-INR  TSH  T4, FREE  MAGNESIUM    EKG EKG Interpretation  Date/Time:  Monday September 21 2019 20:29:35 EST Ventricular Rate:  126 PR Interval:    QRS Duration: 92 QT Interval:  322 QTC Calculation: 467 R Axis:   -155 Text Interpretation: Atrial fibrillation with rapid ventricular response Non-specific ST-t changes Confirmed by Lajean Saver [phone]) on 09/21/2019 8:52:33 PM   Radiology DG Chest 2 View  Result Date: 09/21/2019 CLINICAL DATA:  Atrial fibrillation, short of breath, palpitations EXAM: CHEST - 2 VIEW COMPARISON:  03/31/2018 FINDINGS: The heart size and mediastinal contours are within normal limits. Both lungs are clear. The visualized skeletal structures are unremarkable. IMPRESSION: No active cardiopulmonary disease. Electronically Signed   By: Randa Ngo M.D.   On: 09/21/2019 21:12    Procedures .Cardioversion  Date/Time: 09/21/2019 11:18 PM Performed by: Donovan Persley, [place], MD Authorized by: Lajean Saver, MD   Consent:    Consent obtained:  Written   Consent given by:  Patient   Risks discussed:  Death, induced arrhythmia and pain   Alternatives discussed:  Rate-control medication Pre-procedure details:    Cardioversion basis:  Elective   Rhythm:  Atrial fibrillation   Electrode placement:  Anterior-posterior Patient sedated: Yes. Refer to sedation procedure documentation for details of sedation.  Attempt one:    Cardioversion mode:  Synchronous   Waveform:  Biphasic   Shock (Joules):  200   Shock outcome:  Conversion to normal sinus rhythm Post-procedure details:    Patient status:  Awake   Patient tolerance of procedure:  Tolerated well, no immediate complications .Sedation  Date/Time: 09/21/2019 11:14 PM Performed by: Dyane Broberg, [place], MD Authorized by: Lajean Saver, MD   Consent:    Consent obtained:  Written   Consent given by:  Patient   Risks discussed:  Allergic reaction, dysrhythmia, inadequate sedation,  prolonged hypoxia resulting in organ damage, prolonged sedation necessitating reversal, respiratory compromise necessitating ventilatory assistance and intubation, vomiting and nausea   Alternatives discussed:  Analgesia without sedation Universal protocol:    Procedure explained and questions answered to patient or proxy's satisfaction: yes     Relevant documents present and verified: yes     Immediately prior to procedure a time out was called: yes     Patient identity confirmation method:  Verbally with patient Indications:    Procedure performed:  Cardioversion   Procedure necessitating sedation performed by:  Physician performing sedation Pre-sedation assessment:    Time since last food or drink:  5 hours   ASA classification: class 2 - patient with mild systemic disease     Neck mobility: normal     Mouth opening:  3 or more finger widths   Thyromental  distance:  4 finger widths   Mallampati score:  I - soft palate, uvula, fauces, pillars visible   Pre-sedation assessments completed and reviewed: airway patency, cardiovascular function, hydration status, mental status, nausea/vomiting, pain level, respiratory function and temperature     Pre-sedation assessment completed:  09/21/2019 11:14 PM Immediate pre-procedure details:    Reassessment: Patient reassessed immediately prior to procedure     Reviewed: vital signs, relevant labs/tests and NPO status     Verified: bag valve mask available, emergency equipment available, intubation equipment available, IV patency confirmed, oxygen available, reversal medications available and suction available   Procedure details (see MAR for exact dosages):    Preoxygenation:  Room air   Sedation:  Etomidate   Intended level of sedation: moderate (conscious sedation)   Analgesia:  None   Intra-procedure monitoring:  Blood pressure monitoring, cardiac monitor, continuous capnometry, continuous pulse oximetry, frequent LOC assessments and frequent  vital sign checks   Intra-procedure events: none     Total Provider sedation time (minutes):  10 Post-procedure details:    Post-sedation assessment completed:  09/22/2019 12:38 AM   Attendance: Constant attendance by certified staff until patient recovered     Recovery: Patient returned to pre-procedure baseline     Post-sedation assessments completed and reviewed: airway patency, cardiovascular function, hydration status, mental status, nausea/vomiting, pain level, respiratory function and temperature     Patient is stable for discharge or admission: yes     Patient tolerance:  Tolerated well, no immediate complications   (including critical care time)  Medications Ordered in ED Medications  sodium chloride flush (NS) 0.9 % injection 3 mL (has no administration in time range)  diltiazem (CARDIZEM) 1 mg/mL load via infusion 20 mg (20 mg Intravenous Bolus from Bag 09/21/19 2208)    And  diltiazem (CARDIZEM) 125 mg in dextrose 5% 125 mL (1 mg/mL) infusion (0 mg/hr Intravenous Stopped 09/22/19 0003)  etomidate (AMIDATE) injection 7 mg (7 mg Intravenous Given 09/22/19 0002)    ED Course  I have reviewed the triage vital signs and the nursing notes.  Pertinent labs & imaging results that were available during my care of the patient were reviewed by me and considered in my medical decision making (see chart for details).    MDM Rules/Calculators/A&P                      Here for palpitations found to be in a fib with RVR.  No emergent changes on labs.  H/o paroxysmal a fib, has not been in a fib for almost a year but is currently anticoagulated with eliquis.  Rate controlled with cardizem as above.  As pt anticoagulated cardioverted as above with successful conversion to sinus rhythm.    Rechecked, back at mental status baseline.  Strict return precautions given.  Instructed to follow up with Cardiology in the next few days.  Discharged in stable condition.     Final Clinical Impression(s) /  ED Diagnoses Final diagnoses:  Atrial fibrillation with RVR [facility])    Rx / DC Orders ED Discharge Orders    None       Zayonna Ayuso, [place], MD 09/22/19 0111    Cathren Laine, MD 09/22/19 [phone]

## 2019-09-21 NOTE — ED Notes (Signed)
Medications infusing per MAR. Tolerating well. No distress noted.

## 2019-09-21 NOTE — ED Notes (Signed)
Pt came to the ED via EMS. Pt conscious, breathing, and A&Ox4. Pt endorses "I was at urgent care when the told me to go to the ED because of my Afib". Chest rise and fall equally with non-labored breathing. Lungs clear apex to base. Abd soft and non-tender. Pt denies chest pain, n/v/d, shortness of breath, and f/c. PIVC placed on the LAC 18G which had positive blood return and flushed without pain or infiltration. Blood collected, labeled, and sent to lab. Bed in lowest position with call light within reach. Pt on continuous blood pressure, pulse ox, and cardiac monitor. Will continue to monitor. Awaiting MD eval. No distress noted.

## 2019-09-22 ENCOUNTER — Telehealth (HOSPITAL_COMMUNITY): Payer: Self-pay | Admitting: *Deleted

## 2019-09-22 NOTE — Telephone Encounter (Signed)
Pt canceled ER follow up appt stated he is going to see his cardiologist DR. Durrant in [place] tomorrow.

## 2019-09-22 NOTE — ED Notes (Signed)
Procedural sedation and electrical cardioversion completed by Durene Cal DO and Steinl MD. Pt given sedation medication and was delivered one shock at 200j which resulted in successful cardioversion to normal sinus rhythm. No distress noted. Pt returned consciousness. Chest rise and fall equally with non-labored breathing.

## 2019-09-23 ENCOUNTER — Ambulatory Visit (HOSPITAL_COMMUNITY): Payer: 59 | Admitting: Physician Assistant

## 2019-11-23 ENCOUNTER — Other Ambulatory Visit: Payer: Self-pay | Admitting: Family Medicine

## 2019-11-23 ENCOUNTER — Other Ambulatory Visit: Payer: Self-pay

## 2019-11-23 ENCOUNTER — Ambulatory Visit
Admission: RE | Admit: 2019-11-23 | Discharge: 2019-11-23 | Disposition: A | Discharge status: Home / Self Care | Payer: 59 | Source: Ambulatory Visit | Attending: Family Medicine | Admitting: Family Medicine

## 2019-11-23 DIAGNOSIS — R61 Generalized hyperhidrosis: Secondary | ICD-10-CM

## 2021-02-17 ENCOUNTER — Other Ambulatory Visit: Payer: Self-pay

## 2021-02-17 ENCOUNTER — Emergency Department: Payer: 59

## 2021-02-17 ENCOUNTER — Encounter: Payer: Self-pay | Admitting: Internal Medicine

## 2021-02-17 ENCOUNTER — Observation Stay
Admission: EM | Admit: 2021-02-17 | Discharge: 2021-02-18 | Disposition: A | Discharge status: Home / Self Care | Payer: 59 | Attending: Hospitalist | Admitting: Hospitalist

## 2021-02-17 DIAGNOSIS — R002 Palpitations: Secondary | ICD-10-CM | POA: Diagnosis present

## 2021-02-17 DIAGNOSIS — Z79899 Other long term (current) drug therapy: Secondary | ICD-10-CM | POA: Diagnosis not present

## 2021-02-17 DIAGNOSIS — E876 Hypokalemia: Secondary | ICD-10-CM | POA: Diagnosis not present

## 2021-02-17 DIAGNOSIS — Z794 Long term (current) use of insulin: Secondary | ICD-10-CM | POA: Insufficient documentation

## 2021-02-17 DIAGNOSIS — I480 Paroxysmal atrial fibrillation: Secondary | ICD-10-CM | POA: Diagnosis not present

## 2021-02-17 DIAGNOSIS — Z20822 Contact with and (suspected) exposure to covid-19: Secondary | ICD-10-CM | POA: Insufficient documentation

## 2021-02-17 DIAGNOSIS — E109 Type 1 diabetes mellitus without complications: Secondary | ICD-10-CM | POA: Diagnosis not present

## 2021-02-17 DIAGNOSIS — E1159 Type 2 diabetes mellitus with other circulatory complications: Secondary | ICD-10-CM | POA: Diagnosis present

## 2021-02-17 DIAGNOSIS — I10 Essential (primary) hypertension: Secondary | ICD-10-CM | POA: Diagnosis not present

## 2021-02-17 DIAGNOSIS — Z7901 Long term (current) use of anticoagulants: Secondary | ICD-10-CM | POA: Diagnosis not present

## 2021-02-17 DIAGNOSIS — E785 Hyperlipidemia, unspecified: Secondary | ICD-10-CM | POA: Diagnosis present

## 2021-02-17 DIAGNOSIS — I4891 Unspecified atrial fibrillation: Secondary | ICD-10-CM

## 2021-02-17 DIAGNOSIS — I152 Hypertension secondary to endocrine disorders: Secondary | ICD-10-CM | POA: Diagnosis present

## 2021-02-17 HISTORY — DX: Paroxysmal atrial fibrillation: I48.0

## 2021-02-17 LAB — BASIC METABOLIC PANEL
Anion gap: 6 (ref 5–15)
BUN: 18 mg/dL (ref 6–20)
CO2: 27 mmol/L (ref 22–32)
Calcium: 9 mg/dL (ref 8.9–10.3)
Chloride: 103 mmol/L (ref 98–111)
Creatinine, Ser: 0.93 mg/dL (ref 0.61–1.24)
GFR, Estimated: >60 mL/min (ref >60)
Glucose, Bld: 90 mg/dL (ref 70–99)
Potassium: 3.3 mmol/L — ABNORMAL LOW (ref 3.5–5.1)
Sodium: 136 mmol/L (ref 135–145)

## 2021-02-17 LAB — CBC
HCT: 50.2 % (ref 39.0–52.0)
Hemoglobin: 15.6 g/dL (ref 13.0–17.0)
MCH: 31 pg (ref 26.0–34.0)
MCHC: 31.1 g/dL (ref 30.0–36.0)
MCV: 99.6 fL (ref 80.0–100.0)
Platelets: 194 10*3/uL (ref 150–400)
RBC: 5.04 MIL/uL (ref 4.22–5.81)
RDW: 12.6 % (ref 11.5–15.5)
WBC: 5.8 10*3/uL (ref 4.0–10.5)
nRBC: 0 % (ref 0.0–0.2)

## 2021-02-17 LAB — TROPONIN I (HIGH SENSITIVITY): Troponin I (High Sensitivity): 6 ng/L (ref <18)

## 2021-02-17 LAB — MAGNESIUM: Magnesium: 2 mg/dL (ref 1.7–2.4)

## 2021-02-17 MED ORDER — DILTIAZEM HCL-DEXTROSE 125-5 MG/125ML-% IV SOLN (PREMIX)
5.0000 mg/h | INTRAVENOUS | Status: DC
  Administered 2021-02-17: 5 mg/h via INTRAVENOUS
  Filled 2021-02-17: qty 125

## 2021-02-17 MED ORDER — DILTIAZEM LOAD VIA INFUSION
10.0000 mg | Freq: Once | INTRAVENOUS | Status: AC
  Administered 2021-02-17: 10 mg via INTRAVENOUS
  Filled 2021-02-17: qty 10

## 2021-02-17 MED ORDER — LACTATED RINGERS IV BOLUS
500.0000 mL | Freq: Once | INTRAVENOUS | Status: AC
  Administered 2021-02-17: 500 mL via INTRAVENOUS

## 2021-02-17 MED ORDER — POTASSIUM CHLORIDE CRYS ER 20 MEQ PO TBCR
40.0000 meq | EXTENDED_RELEASE_TABLET | Freq: Once | ORAL | Status: AC
  Administered 2021-02-18: 40 meq via ORAL
  Filled 2021-02-17: qty 2

## 2021-02-17 MED ORDER — DILTIAZEM HCL 60 MG PO TABS
30.0000 mg | ORAL_TABLET | Freq: Once | ORAL | Status: AC
  Administered 2021-02-17: 30 mg via ORAL
  Filled 2021-02-17: qty 1

## 2021-02-17 NOTE — ED Notes (Signed)
ED Provider at bedside. 

## 2021-02-17 NOTE — ED Notes (Signed)
Green tube for BMP, Mag, Trop recollected per lab request.

## 2021-02-17 NOTE — H&P (Addendum)
History and Physical    Steven Kelm Sr. ZHG:[id number] DOB: Jun 11, 1961 DOA: 02/17/2021  PCP: Johny Blamer, MD  Patient coming from: Home  I have personally briefly reviewed patient's old medical records in [facility]  Chief Complaint: Palpitations  HPI: Steven Schinke Sr. is a 60 y.o. male with medical history significant for paroxysmal atrial fibrillation on Eliquis, type 1 diabetes on insulin pump, hypertension, hyperlipidemia who presented to the ED for evaluation of palpitations.  Patient states he was in his usual state of health earlier today (7/8).  He was leaving work and when he walked down a flight of steps he began to feel his heart racing and skipping beats.  He went home and his palpitations persisted therefore he came to the ED for further evaluation.  He is currently taking diltiazem 120 mg daily and flecainide 50 mg twice daily for management of his A. fib.  He is on Eliquis for anticoagulation.  He says his cardiologist (follows with [facility]) gave him 2 pills (he is not sure the name of the medication) to use as pill in pocket method when his palpitations occur however he lost the pills therefore did not take it prior to coming to the hospital.  He otherwise denies any chest pain, dyspnea, cough, diaphoresis, nausea, vomiting, abdominal pain, dysuria, or peripheral edema.   He does state that he is supposed to use a CPAP at night for sleep apnea but has not been using it regularly.  ED Course:  Initial vitals showed BP 122/73, pulse 126, RR 18, temp 97.9 F, SPO2 100% on room air.  Labs show potassium 3.3, magnesium 2.0, sodium 136, bicarb 27, BUN 18, creatinine 0.93, serum glucose 90, high-sensitivity troponin 6, WBC 5.8, hemoglobin 15.6, platelets 194,000.  2 view chest x-ray negative for focal consolidation, edema, effusion.  EKG showed A. fib with RVR, rate 119, LVH.  Patient was given 500 mLs LR, oral K 40 mill equivalents, oral  diltiazem 30 mg.  Patient remained in A. fib RVR and was started on diltiazem infusion.  The hospitalist service was consulted to admit for further evaluation and management.  Review of Systems: All systems reviewed and are negative except as documented in history of present illness above.   Past Medical History:  Diagnosis Date   Diabetes mellitus without complication (HCC)    Hyperlipidemia    Paroxysmal atrial fibrillation [facility])     Past Surgical History:  Procedure Laterality Date   BACK SURGERY     CARDIAC CATHETERIZATION  2007    Social History:  reports that he does not drink alcohol and does not use drugs. No history on file for tobacco use.  No Known Allergies  Family History  Problem Relation Age of Onset   Diabetes Mother    Stroke Mother    Heart disease Mother      Prior to Admission medications   Medication Sig Start Date End Date Taking? Authorizing Provider  apixaban (ELIQUIS) 5 MG TABS tablet Take 5 mg by mouth 2 (two) times daily.    [provider]  atorvastatin (LIPITOR) 20 MG tablet Take 20 mg by mouth at bedtime.    [provider]  diltiazem (DILACOR XR) 120 MG 24 hr capsule Take 120 mg by mouth daily.    [provider]  hydrochlorothiazide (HYDRODIURIL) 12.5 MG tablet Take 12.5 mg by mouth daily.    [provider]  Insulin Human (INSULIN PUMP) 100 unit/ml SOLN Inject 1 each into  the skin as directed. Replace insulin vial in pump every 3 days (patient uses Humalog)    [provider]  pantoprazole (PROTONIX) 40 MG tablet Take 1 tablet (40 mg total) by mouth daily. Patient not taking: Reported on 09/21/2019 06/27/18   Georgetta Haber, NP    Physical Exam: Vitals:   02/17/21 2220 02/17/21 2227 02/17/21 2317 02/17/21 2322  BP:      Pulse: (!) 101 (!) 104 (!) 104 91  Resp: 13 18 (!) 23 (!) 23  Temp:      TempSrc:      SpO2: 96% 97% 97% 98%  Weight:      Height:       Constitutional: Resting in bed,  NAD, calm, comfortable Eyes: PERRL, lids and conjunctivae normal ENMT: Mucous membranes are moist. Posterior pharynx clear of any exudate or lesions.Normal dentition.  Neck: normal, supple, no masses. Respiratory: clear to auscultation bilaterally, no wheezing, no crackles. Normal respiratory effort. No accessory muscle use.  Cardiovascular: Irregularly irregular, tachycardic. No extremity edema. 2+ pedal pulses. Abdomen: no tenderness, no masses palpated. No hepatosplenomegaly. Bowel sounds positive.  Musculoskeletal: no clubbing / cyanosis. No joint deformity upper and lower extremities. Good ROM, no contractures. Normal muscle tone.  Skin: no rashes, lesions, ulcers. No induration Neurologic: CN 2-12 grossly intact. Sensation intact. Strength 5/5 in all 4.  Psychiatric: Normal judgment and insight. Alert and oriented x 3. Normal mood.   Labs on Admission: I have personally reviewed following labs and imaging studies  CBC: Recent Labs  Lab 02/17/21 2118  WBC 5.8  HGB 15.6  HCT 50.2  MCV 99.6  PLT 194   Basic Metabolic Panel: Recent Labs  Lab 02/17/21 2146  NA 136  K 3.3*  CL 103  CO2 27  GLUCOSE 90  BUN 18  CREATININE 0.93  CALCIUM 9.0  MG 2.0   GFR: Estimated Creatinine Clearance: 94.9 mL/min (by C-G formula based on SCr of 0.93 mg/dL). Liver Function Tests: No results for input(s): AST, ALT, ALKPHOS, BILITOT, PROT, ALBUMIN in the last 168 hours. No results for input(s): LIPASE, AMYLASE in the last 168 hours. No results for input(s): AMMONIA in the last 168 hours. Coagulation Profile: No results for input(s): INR, PROTIME in the last 168 hours. Cardiac Enzymes: No results for input(s): CKTOTAL, CKMB, CKMBINDEX, TROPONINI in the last 168 hours. BNP (last 3 results) No results for input(s): PROBNP in the last 8760 hours. HbA1C: No results for input(s): HGBA1C in the last 72 hours. CBG: No results for input(s): GLUCAP in the last 168 hours. Lipid Profile: No  results for input(s): CHOL, HDL, LDLCALC, TRIG, CHOLHDL, LDLDIRECT in the last 72 hours. Thyroid Function Tests: No results for input(s): TSH, T4TOTAL, FREET4, T3FREE, THYROIDAB in the last 72 hours. Anemia Panel: No results for input(s): VITAMINB12, FOLATE, FERRITIN, TIBC, IRON, RETICCTPCT in the last 72 hours. Urine analysis:    Component Value Date/Time   COLORURINE YELLOW 12/09/2012 1349   APPEARANCEUR CLEAR 12/09/2012 1349   LABSPEC 1.040 (H) 12/09/2012 1349   PHURINE 5.5 12/09/2012 1349   GLUCOSEU >1000 (A) 12/09/2012 1349   HGBUR NEGATIVE 12/09/2012 1349   BILIRUBINUR NEGATIVE 12/09/2012 1349   KETONESUR 15 (A) 12/09/2012 1349   PROTEINUR NEGATIVE 12/09/2012 1349   UROBILINOGEN 1.0 12/09/2012 1349   NITRITE NEGATIVE 12/09/2012 1349   LEUKOCYTESUR NEGATIVE 12/09/2012 1349    Radiological Exams on Admission: DG Chest 2 View  Result Date: 02/17/2021 CLINICAL DATA:  Tachycardia EXAM: CHEST - 2 VIEW COMPARISON:  11/23/2019 FINDINGS: The heart size and mediastinal contours are within normal limits. Both lungs are clear. The visualized skeletal structures are unremarkable. IMPRESSION: No active cardiopulmonary disease. Electronically Signed   By: Jasmine Pang M.D.   On: 02/17/2021 22:42    EKG: Personally reviewed.  Atrial fibrillation with RVR, rate 119, LVH, high amplitude T waves.  T wave changes new when compared to prior otherwise no significant change.  Assessment/Plan Principal Problem:   Paroxysmal atrial fibrillation with rapid ventricular response (HCC) Active Problems:   Type 1 diabetes mellitus without complication (HCC)   Hyperlipidemia   Hypokalemia   Hypertension associated with diabetes (HCC)   Steven Adan Sr. is a 60 y.o. male with medical history significant for paroxysmal atrial fibrillation on Eliquis, insulin-dependent diabetes, hyperlipidemia who is admitted with paroxysmal A. fib with RVR.  Paroxysmal atrial fibrillation with RVR: -Started on IV  diltiazem drip -Convert to oral diltiazem once adequate rate/rhythm control achieved -Continue home flecainide 50 mg twice daily -Continue Eliquis 5 mg twice daily  Type 1 diabetes: Continue insulin pump.  Hypokalemia: Oral supplement given.  Magnesium 2.0.  Hypertension: Holding HCTZ with hypokalemia and while on diltiazem drip.  Hyperlipidemia: Continue atorvastatin.   DVT prophylaxis: Eliquis Code Status: Full code, confirmed with patient Family Communication: Discussed with patient, he has discussed with family Disposition Plan: From home and likely her discharge to home pending control of A. fib RVR Consults called: None Level of care: Progressive Cardiac Admission status:  Status is: Observation  The patient remains OBS appropriate and will d/c before 2 midnights.  Dispo: The patient is from: Home              Anticipated d/c is to: Home              Patient currently is not medically stable to d/c.   Difficult to place patient No  Darreld Mclean MD [facility]  If 7PM-7AM, please contact night-coverage [email or web address]  02/18/2021, 12:08 AM

## 2021-02-17 NOTE — ED Notes (Signed)
Pt. returned from XR. 

## 2021-02-17 NOTE — ED Notes (Signed)
Admitting MD at bedside.

## 2021-02-17 NOTE — ED Provider Notes (Signed)
[facility] Emergency Department Provider Note  ____________________________________________   Event Date/Time   First MD Initiated Contact with Patient 02/17/21 2121     (approximate)  I have reviewed the triage vital signs and the nursing notes.   HISTORY  Chief Complaint Tachycardia   HPI Steven Keith. is a 60 y.o. male with past medical history of HDL, DM and A. fib anticoagulated on Eliquis and rate controlled on diltiazem as well as on flecainide who presents for assessment of some palpitations and sensation of racing heart that started shortly prior to arrival.  Patient denies any chest pain, cough, shortness of breath, nausea, vomiting, diarrhea, dysuria, rash, recent injuries or falls or any other acute complaints.  Denies tobacco abuse, EtOH use or illicit drug use.  Denies any other acute concerns at this time.  States it has been at least several weeks and significantly has happened.  He has been compliant with all his medications.         Past Medical History:  Diagnosis Date   Diabetes mellitus without complication (HCC)    Hyperlipidemia    Paroxysmal atrial fibrillation [facility])     Patient Active Problem List   Diagnosis Date Noted   Paroxysmal atrial fibrillation with rapid ventricular response (HCC) 02/17/2021     Past Surgical History:  Procedure Laterality Date   BACK SURGERY     CARDIAC CATHETERIZATION  2007    Prior to Admission medications   Medication Sig Start Date End Date Taking? Authorizing Provider  apixaban (ELIQUIS) 5 MG TABS tablet Take 5 mg by mouth 2 (two) times daily.    [provider]  atorvastatin (LIPITOR) 20 MG tablet Take 20 mg by mouth at bedtime.    [provider]  diltiazem (DILACOR XR) 120 MG 24 hr capsule Take 120 mg by mouth daily.    [provider]  hydrochlorothiazide (HYDRODIURIL) 12.5 MG tablet Take 12.5 mg by mouth daily.    [provider]   Insulin Human (INSULIN PUMP) 100 unit/ml SOLN Inject 1 each into the skin as directed. Replace insulin vial in pump every 3 days (patient uses Humalog)    [provider]  pantoprazole (PROTONIX) 40 MG tablet Take 1 tablet (40 mg total) by mouth daily. Patient not taking: Reported on 09/21/2019 06/27/18   Georgetta Haber, NP    Allergies Patient has no known allergies.  No family history on file.  Social History Social History   Tobacco Use   Smoking status: Never  Substance Use Topics   Alcohol use: No   Drug use: No    Review of Systems  Review of Systems  Constitutional:  Negative for chills and fever.  HENT:  Negative for sore throat.   Eyes:  Negative for pain.  Respiratory:  Negative for cough and stridor.   Cardiovascular:  Positive for palpitations. Negative for chest pain.  Gastrointestinal:  Negative for vomiting.  Genitourinary:  Negative for dysuria.  Musculoskeletal:  Negative for myalgias.  Skin:  Negative for rash.  Neurological:  Negative for seizures, loss of consciousness and headaches.  Psychiatric/Behavioral:  Negative for suicidal ideas.   All other systems reviewed and are negative.    ____________________________________________   PHYSICAL EXAM:  VITAL SIGNS: ED Triage Vitals [02/17/21 2111]  Enc Vitals Group     BP 122/73     Pulse Rate (!) 126     Resp 18     Temp 97.9 F (36.6 C)  Temp Source Oral     SpO2 100 %     Weight 175 lb (79.4 kg)     Height 6\' 3"  (1.905 m)     Head Circumference      Peak Flow      Pain Score 0     Pain Loc      Pain Edu?      Excl. in GC?    Vitals:   02/17/21 2317 02/17/21 2322  BP:    Pulse: (!) 104 91  Resp: (!) 23 (!) 23  Temp:    SpO2: 97% 98%   Physical Exam Vitals and nursing note reviewed.  Constitutional:      Appearance: He is well-developed.  HENT:     Head: Normocephalic and atraumatic.     Right Ear: External ear normal.     Left Ear: External ear normal.      Nose: Nose normal.  Eyes:     Conjunctiva/sclera: Conjunctivae normal.  Cardiovascular:     Rate and Rhythm: Tachycardia present. Rhythm irregular.     Heart sounds: No murmur heard. Pulmonary:     Effort: Pulmonary effort is normal. No respiratory distress.     Breath sounds: Normal breath sounds.  Abdominal:     Palpations: Abdomen is soft.     Tenderness: There is no abdominal tenderness.  Musculoskeletal:     Cervical back: Neck supple.  Skin:    General: Skin is warm and dry.     Capillary Refill: Capillary refill takes less than 2 seconds.  Neurological:     Mental Status: He is alert and oriented to person, place, and time.  Psychiatric:        Mood and Affect: Mood normal.     ____________________________________________   LABS (all labs ordered are listed, but only abnormal results are displayed)  Labs Reviewed  BASIC METABOLIC PANEL - Abnormal; Notable for the following components:      Result Value   Potassium 3.3 (*)    All other components within normal limits  CBC  MAGNESIUM  TROPONIN I (HIGH SENSITIVITY)  TROPONIN I (HIGH SENSITIVITY)   ____________________________________________  EKG  Arrival ECG with evidence of A. fib with RVR with a rate of 119, right axis deviation, unremarkable intervals with fairly large ST elevation in V3 and some peak T waves in V4.  Also some nonspecific changes in inferior leads. ____________________________________________  RADIOLOGY  ED MD interpretation: No focal consolidation, large effusion, significant edema, pneumothorax or any other clear acute intrathoracic process.  Official radiology report(s): DG Chest 2 View  Result Date: 02/17/2021 CLINICAL DATA:  Tachycardia EXAM: CHEST - 2 VIEW COMPARISON:  11/23/2019 FINDINGS: The heart size and mediastinal contours are within normal limits. Both lungs are clear. The visualized skeletal structures are unremarkable. IMPRESSION: No active cardiopulmonary disease.  Electronically Signed   By: 01/23/2020 M.D.   On: 02/17/2021 22:42    ____________________________________________   PROCEDURES  Procedure(s) performed (including Critical Care):  .Critical Care  Date/Time: 02/17/2021 11:31 PM Performed by: 04/20/2021, MD Authorized by: Gilles Chiquito, MD   Critical care provider statement:    Critical care time (minutes):  45   Critical care was necessary to treat or prevent imminent or life-threatening deterioration of the following conditions:  Cardiac failure   Critical care was time spent personally by me on the following activities:  Discussions with consultants, evaluation of patient's response to treatment, examination of patient, ordering and performing treatments  and interventions, ordering and review of laboratory studies, ordering and review of radiographic studies, pulse oximetry, re-evaluation of patient's condition, obtaining history from patient or surrogate and review of old charts   ____________________________________________   INITIAL IMPRESSION / ASSESSMENT AND PLAN / ED COURSE      Patient presents with above-stated history exam for assessment of palpitations that began earlier today in the setting of known history of A. fib.  Patient states he has been compliant with his medicines.  He denies any pain.  On arrival he is tachycardic with rates in the 120s to 140s with otherwise stable vital signs on room air.  ECG does show A. fib with RVR with rate in the 120s.  Chest x-ray has no evidence of heart failure pneumonia or other clear acute intrathoracic process.  Patient denies any chest pain while initial troponin is 6 Evalose patient for ACS but will plan to obtain a delta given nonspecific findings on EKG possibly demand related.  BMP with a K of 3.3 but no other significant electrolyte or metabolic derangements.  Magnesium is unremarkable.  Overall unclear precipitant causing patient's RVR today.  He was given dose of  p.o. Cardizem and some IV fluids but still had persistent symptoms and rates in the 120s to 140s on reassessment.  Will place on diltiazem drip and admit to medicine service.  ____________________________________________   FINAL CLINICAL IMPRESSION(S) / ED DIAGNOSES  Final diagnoses:  Atrial fibrillation with RVR (HCC)  Hypokalemia    Medications  potassium chloride SA (KLOR-CON) CR tablet 40 mEq (has no administration in time range)  diltiazem (CARDIZEM) 1 mg/mL load via infusion 10 mg (has no administration in time range)    And  diltiazem (CARDIZEM) 125 mg in dextrose 5% 125 mL (1 mg/mL) infusion (has no administration in time range)  lactated ringers bolus 500 mL (0 mLs Intravenous Stopped 02/17/21 2216)  diltiazem (CARDIZEM) tablet 30 mg (30 mg Oral Given 02/17/21 2216)     ED Discharge Orders     None        Note:  This document was prepared using Dragon voice recognition software and may include unintentional dictation errors.    Gilles Chiquito, MD 02/17/21 [phone]

## 2021-02-17 NOTE — ED Triage Notes (Signed)
Pt with history of a fib. Pt states "it's acting up again". Pt denies pain, no shob, no vomiting/nausea. Pt with history of cardioversion.

## 2021-02-18 DIAGNOSIS — I480 Paroxysmal atrial fibrillation: Secondary | ICD-10-CM | POA: Diagnosis not present

## 2021-02-18 DIAGNOSIS — I152 Hypertension secondary to endocrine disorders: Secondary | ICD-10-CM | POA: Diagnosis present

## 2021-02-18 LAB — BASIC METABOLIC PANEL
Anion gap: 9 (ref 5–15)
BUN: 16 mg/dL (ref 6–20)
CO2: 26 mmol/L (ref 22–32)
Calcium: 8.6 mg/dL — ABNORMAL LOW (ref 8.9–10.3)
Chloride: 100 mmol/L (ref 98–111)
Creatinine, Ser: 0.97 mg/dL (ref 0.61–1.24)
GFR, Estimated: >60 mL/min (ref >60)
Glucose, Bld: 256 mg/dL — ABNORMAL HIGH (ref 70–99)
Potassium: 4.4 mmol/L (ref 3.5–5.1)
Sodium: 135 mmol/L (ref 135–145)

## 2021-02-18 LAB — CBG MONITORING, ED: Glucose-Capillary: 174 mg/dL — ABNORMAL HIGH (ref 70–99)

## 2021-02-18 LAB — HIV ANTIBODY (ROUTINE TESTING W REFLEX): HIV Screen 4th Generation wRfx: NONREACTIVE

## 2021-02-18 LAB — TROPONIN I (HIGH SENSITIVITY): Troponin I (High Sensitivity): 6 ng/L (ref <18)

## 2021-02-18 LAB — MAGNESIUM: Magnesium: 2 mg/dL (ref 1.7–2.4)

## 2021-02-18 LAB — SARS CORONAVIRUS 2 (TAT 6-24 HRS): SARS Coronavirus 2: NEGATIVE

## 2021-02-18 MED ORDER — ONDANSETRON HCL 4 MG PO TABS: 4.0000 mg | ORAL_TABLET | Freq: Four times a day (QID) | ORAL | Status: DC | PRN

## 2021-02-18 MED ORDER — ONDANSETRON HCL 4 MG/2ML IJ SOLN: 4.0000 mg | Freq: Four times a day (QID) | INTRAMUSCULAR | Status: DC | PRN

## 2021-02-18 MED ORDER — FLECAINIDE ACETATE 50 MG PO TABS
50.0000 mg | ORAL_TABLET | Freq: Two times a day (BID) | ORAL | Status: DC
  Filled 2021-02-18: qty 1

## 2021-02-18 MED ORDER — INSULIN PUMP
Freq: Three times a day (TID) | SUBCUTANEOUS | Status: DC
  Filled 2021-02-18: qty 1

## 2021-02-18 MED ORDER — ACETAMINOPHEN 650 MG RE SUPP: 650.0000 mg | Freq: Four times a day (QID) | RECTAL | Status: DC | PRN

## 2021-02-18 MED ORDER — FLECAINIDE ACETATE 50 MG PO TABS
50.0000 mg | ORAL_TABLET | Freq: Two times a day (BID) | ORAL | Status: DC
  Administered 2021-02-18: 50 mg via ORAL
  Filled 2021-02-18 (×2): qty 1

## 2021-02-18 MED ORDER — ACETAMINOPHEN 325 MG PO TABS: 650.0000 mg | ORAL_TABLET | Freq: Four times a day (QID) | ORAL | Status: DC | PRN

## 2021-02-18 MED ORDER — SODIUM CHLORIDE 0.9% FLUSH
3.0000 mL | Freq: Two times a day (BID) | INTRAVENOUS | Status: DC
  Administered 2021-02-18: 3 mL via INTRAVENOUS

## 2021-02-18 MED ORDER — DILTIAZEM HCL 60 MG PO TABS: 60.0000 mg | ORAL_TABLET | Freq: Every day | ORAL | 0 refills | Status: DC | PRN

## 2021-02-18 MED ORDER — DILTIAZEM HCL 60 MG PO TABS
30.0000 mg | ORAL_TABLET | Freq: Four times a day (QID) | ORAL | Status: DC
  Administered 2021-02-18: 30 mg via ORAL
  Filled 2021-02-18: qty 1

## 2021-02-18 MED ORDER — DILTIAZEM HCL ER COATED BEADS 120 MG PO CP24
120.0000 mg | ORAL_CAPSULE | Freq: Every day | ORAL | Status: DC
  Administered 2021-02-18: 120 mg via ORAL
  Filled 2021-02-18 (×2): qty 1

## 2021-02-18 MED ORDER — APIXABAN 5 MG PO TABS
5.0000 mg | ORAL_TABLET | Freq: Two times a day (BID) | ORAL | Status: DC
  Administered 2021-02-18: 5 mg via ORAL
  Filled 2021-02-18: qty 1

## 2021-02-18 MED ORDER — ATORVASTATIN CALCIUM 20 MG PO TABS
20.0000 mg | ORAL_TABLET | Freq: Every day | ORAL | Status: DC
  Filled 2021-02-18: qty 1

## 2021-02-18 NOTE — ED Notes (Signed)
Pt ambulated to toilet. Then back to bed.  

## 2021-02-18 NOTE — ED Notes (Signed)
Pt ambulatory to toilet with steady gait noted.  

## 2021-02-18 NOTE — Discharge Summary (Signed)
Physician Discharge Summary   Steven Heard Sr.  male DOB: Mar 31, 1961  ZOX:[id number]  PCP: Johny Blamer, MD  Admit date: 02/17/2021 Discharge date: 02/18/2021  Admitted From: home Disposition:  home CODE STATUS: Full code  Discharge Instructions     Discharge instructions   Complete by: As directed    Since you don't know the name of your "pills in the pocket" given to you by your cardiologist, I will prescribe you diltiazem 60 mg tablet, that you can take if you feel heart palpitation.  If you still have palpitation 2 hours after taking diltiazem 60 mg, then please go to the ED.  Please follow up with your outpatient cardiology on Monday 02/20/21.   Dr. Darlin Priestly [facility] Course:  For full details, please see H&P, progress notes, consult notes and ancillary notes.  Briefly,  Steven Gift Sr. is a 60 y.o. male with medical history significant for paroxysmal atrial fibrillation on Eliquis, type 1 diabetes on insulin pump, hypertension, hyperlipidemia who presented to the ED for evaluation of palpitations.  He is currently taking diltiazem 120 mg daily and flecainide 50 mg twice daily for management of his A. fib.  He is on Eliquis for anticoagulation.  He says his cardiologist (follows with [facility]) gave him 2 pills (he is not sure the name of the medication) to use as pill in pocket method when his palpitations occur however he lost the pills therefore did not take it prior to coming to the hospital.  Paroxysmal atrial fibrillation with RVR: -Pt was started on IV diltiazem drip and rate controlled by next morning.  Home diltiazem and flecainide resumed.  Pt felt well and wanted to be discharged, and can follow up with his outpatient cardiology on Monday (2 days after discharge).  Dilt 60 mg tablet was prescribed as his temporary substitute "pills in the pocket" to be taken if palpitation returns before he sees his outpatient cardiologist.     Type 1 diabetes: Continue insulin pump.   Hypokalemia: Oral supplement given.  Magnesium 2.0.   Hypertension: HCTZ held with hypokalemia and while on diltiazem drip, resumed at discharge.  Continued home dilt.   Hyperlipidemia: Continue atorvastatin.   Discharge Diagnoses:  Principal Problem:   Paroxysmal atrial fibrillation with rapid ventricular response (HCC) Active Problems:   Type 1 diabetes mellitus without complication (HCC)   Hyperlipidemia   Hypokalemia   Hypertension associated with diabetes [facility])     Discharge Instructions:  Allergies as of 02/18/2021   No Known Allergies      Medication List     STOP taking these medications    pantoprazole 40 MG tablet Commonly known as: Protonix       TAKE these medications    apixaban 5 MG Tabs tablet Commonly known as: ELIQUIS Take 5 mg by mouth 2 (two) times daily.   atorvastatin 20 MG tablet Commonly known as: LIPITOR Take 20 mg by mouth at bedtime.   diltiazem 120 MG 24 hr capsule Commonly known as: DILACOR XR Take 120 mg by mouth daily. What changed: Another medication with the same name was added. Make sure you understand how and when to take each.   diltiazem 60 MG tablet Commonly known as: Cardizem Take 1 tablet (60 mg total) by mouth daily as needed. take 1 if you feel heart palpitation, and if palpitation doesn't resolve 2 hours after you take it, then go to the ED. What changed:  You were already taking a medication with the same name, and this prescription was added. Make sure you understand how and when to take each.   flecainide 50 MG tablet Commonly known as: TAMBOCOR Take 50 mg by mouth 2 (two) times daily.   hydrochlorothiazide 12.5 MG tablet Commonly known as: HYDRODIURIL Take 12.5 mg by mouth daily.   insulin pump Soln Inject 1 each into the skin as directed. Replace insulin vial in pump every 3 days (patient uses Humalog)         Follow-up Information     Johny Blamer, MD Follow up in 1 week(s).   Specialty: Family Medicine Contact information: [phone] [address] [place] [address] [phone]         Coralee Rud, PA-C Follow up on 02/20/2021.   Specialty: Cardiology Contact information: [facility]  [address] [place] [address] [phone]                 No Known Allergies   The results of significant diagnostics from this hospitalization (including imaging, microbiology, ancillary and laboratory) are listed below for reference.   Consultations:   Procedures/Studies: DG Chest 2 View  Result Date: 02/17/2021 CLINICAL DATA:  Tachycardia EXAM: CHEST - 2 VIEW COMPARISON:  11/23/2019 FINDINGS: The heart size and mediastinal contours are within normal limits. Both lungs are clear. The visualized skeletal structures are unremarkable. IMPRESSION: No active cardiopulmonary disease. Electronically Signed   By: Jasmine Pang M.D.   On: 02/17/2021 22:42      Labs: BNP (last 3 results) No results for input(s): BNP in the last 8760 hours. Basic Metabolic Panel: Recent Labs  Lab 02/17/21 2146 02/18/21 0954  NA 136 135  K 3.3* 4.4  CL 103 100  CO2 27 26  GLUCOSE 90 256*  BUN 18 16  CREATININE 0.93 0.97  CALCIUM 9.0 8.6*  MG 2.0 2.0   Liver Function Tests: No results for input(s): AST, ALT, ALKPHOS, BILITOT, PROT, ALBUMIN in the last 168 hours. No results for input(s): LIPASE, AMYLASE in the last 168 hours. No results for input(s): AMMONIA in the last 168 hours. CBC: Recent Labs  Lab 02/17/21 2118  WBC 5.8  HGB 15.6  HCT 50.2  MCV 99.6  PLT 194   Cardiac Enzymes: No results for input(s): CKTOTAL, CKMB, CKMBINDEX, TROPONINI in the last 168 hours. BNP: Invalid input(s): POCBNP CBG: Recent Labs  Lab 02/18/21 1148  GLUCAP 174*   D-Dimer No results for input(s): DDIMER in the last 72 hours. Hgb A1c No results for input(s): HGBA1C in the last 72 hours. Lipid  Profile No results for input(s): CHOL, HDL, LDLCALC, TRIG, CHOLHDL, LDLDIRECT in the last 72 hours. Thyroid function studies No results for input(s): TSH, T4TOTAL, T3FREE, THYROIDAB in the last 72 hours.  Invalid input(s): FREET3 Anemia work up No results for input(s): VITAMINB12, FOLATE, FERRITIN, TIBC, IRON, RETICCTPCT in the last 72 hours. Urinalysis    Component Value Date/Time   COLORURINE YELLOW 12/09/2012 1349   APPEARANCEUR CLEAR 12/09/2012 1349   LABSPEC 1.040 (H) 12/09/2012 1349   PHURINE 5.5 12/09/2012 1349   GLUCOSEU >1000 (A) 12/09/2012 1349   HGBUR NEGATIVE 12/09/2012 1349   BILIRUBINUR NEGATIVE 12/09/2012 1349   KETONESUR 15 (A) 12/09/2012 1349   PROTEINUR NEGATIVE 12/09/2012 1349   UROBILINOGEN 1.0 12/09/2012 1349   NITRITE NEGATIVE 12/09/2012 1349   LEUKOCYTESUR NEGATIVE 12/09/2012 1349   Sepsis Labs Invalid input(s): PROCALCITONIN,  WBC,  LACTICIDVEN Microbiology  No results found for this or any previous visit (from the past 240 hour(s)).   Total time spend on discharging this patient, including the last patient exam, discussing the hospital stay, instructions for ongoing care as it relates to all pertinent caregivers, as well as preparing the medical discharge records, prescriptions, and/or referrals as applicable, is 50 minutes.    Darlin Priestly, MD  [facility] 02/18/2021, 12:37 PM

## 2021-02-18 NOTE — Progress Notes (Signed)
Discharge instructions given to patient. Verbalized understanding. IV removed. No acute distress at this time. Patient is currently in room eating lunch and awaiting his ride home.

## 2021-02-18 NOTE — ED Notes (Signed)
Called lab to remind them this pt is still waiting on his morning labs to be drawn.

## 2021-02-18 NOTE — Plan of Care (Signed)
CBG 174.  Patient insulin indicated - NO insulin to give at this time.

## 2021-02-20 LAB — CBG MONITORING, ED: Glucose-Capillary: 171 mg/dL — ABNORMAL HIGH (ref 70–99)

## 2021-04-10 ENCOUNTER — Emergency Department: Payer: 59

## 2021-04-10 ENCOUNTER — Other Ambulatory Visit: Payer: Self-pay

## 2021-04-10 ENCOUNTER — Encounter: Payer: Self-pay | Admitting: Radiology

## 2021-04-10 DIAGNOSIS — Z79899 Other long term (current) drug therapy: Secondary | ICD-10-CM | POA: Insufficient documentation

## 2021-04-10 DIAGNOSIS — I4891 Unspecified atrial fibrillation: Secondary | ICD-10-CM | POA: Insufficient documentation

## 2021-04-10 DIAGNOSIS — I10 Essential (primary) hypertension: Secondary | ICD-10-CM | POA: Diagnosis not present

## 2021-04-10 DIAGNOSIS — Z794 Long term (current) use of insulin: Secondary | ICD-10-CM | POA: Diagnosis not present

## 2021-04-10 DIAGNOSIS — E109 Type 1 diabetes mellitus without complications: Secondary | ICD-10-CM | POA: Diagnosis not present

## 2021-04-10 DIAGNOSIS — Z7901 Long term (current) use of anticoagulants: Secondary | ICD-10-CM | POA: Insufficient documentation

## 2021-04-10 DIAGNOSIS — R002 Palpitations: Secondary | ICD-10-CM | POA: Diagnosis present

## 2021-04-10 LAB — CBC
HCT: 44.6 % (ref 39.0–52.0)
Hemoglobin: 14.8 g/dL (ref 13.0–17.0)
MCH: 30.8 pg (ref 26.0–34.0)
MCHC: 33.2 g/dL (ref 30.0–36.0)
MCV: 92.9 fL (ref 80.0–100.0)
Platelets: 204 10*3/uL (ref 150–400)
RBC: 4.8 MIL/uL (ref 4.22–5.81)
RDW: 12.5 % (ref 11.5–15.5)
WBC: 6.3 10*3/uL (ref 4.0–10.5)
nRBC: 0 % (ref 0.0–0.2)

## 2021-04-10 LAB — COMPREHENSIVE METABOLIC PANEL
ALT: 23 U/L (ref 0–44)
AST: 26 U/L (ref 15–41)
Albumin: 3.7 g/dL (ref 3.5–5.0)
Alkaline Phosphatase: 51 U/L (ref 38–126)
Anion gap: 6 (ref 5–15)
BUN: 16 mg/dL (ref 6–20)
CO2: 30 mmol/L (ref 22–32)
Calcium: 8.7 mg/dL — ABNORMAL LOW (ref 8.9–10.3)
Chloride: 102 mmol/L (ref 98–111)
Creatinine, Ser: 1.02 mg/dL (ref 0.61–1.24)
GFR, Estimated: >60 mL/min (ref >60)
Glucose, Bld: 313 mg/dL — ABNORMAL HIGH (ref 70–99)
Potassium: 4 mmol/L (ref 3.5–5.1)
Sodium: 138 mmol/L (ref 135–145)
Total Bilirubin: 1.2 mg/dL (ref 0.3–1.2)
Total Protein: 6.7 g/dL (ref 6.5–8.1)

## 2021-04-10 LAB — TROPONIN I (HIGH SENSITIVITY): Troponin I (High Sensitivity): 11 ng/L (ref <18)

## 2021-04-10 NOTE — ED Triage Notes (Signed)
Pt in with co palpitations since 1600 today, pt has hx of afib. Pt normally takes cardizem in am , was told to take xtra 60 mg at 1800 without relief.

## 2021-04-11 ENCOUNTER — Emergency Department
Admission: EM | Admit: 2021-04-11 | Discharge: 2021-04-11 | Disposition: A | Discharge status: Home / Self Care | Payer: 59 | Attending: Emergency Medicine | Admitting: Emergency Medicine

## 2021-04-11 DIAGNOSIS — I4891 Unspecified atrial fibrillation: Secondary | ICD-10-CM

## 2021-04-11 DIAGNOSIS — R002 Palpitations: Secondary | ICD-10-CM

## 2021-04-11 LAB — TSH: TSH: 1.717 u[IU]/mL (ref 0.350–4.500)

## 2021-04-11 LAB — T4, FREE: Free T4: 0.9 ng/dL (ref 0.61–1.12)

## 2021-04-11 LAB — TROPONIN I (HIGH SENSITIVITY): Troponin I (High Sensitivity): 11 ng/L (ref <18)

## 2021-04-11 NOTE — ED Notes (Signed)
E-signature pad unavailable - Pt verbalized understanding of D/C information - no additional concerns at this time.  

## 2021-04-11 NOTE — Discharge Instructions (Addendum)
You may take an extra 60mg  Diltiazem for palpitations as previously instructed.  Return to the ER for worsening symptoms, persistent vomiting, difficulty breathing or other concerns.

## 2021-04-11 NOTE — ED Provider Notes (Signed)
[facility] Emergency Department Provider Note   ____________________________________________   Event Date/Time   First MD Initiated Contact with Patient 04/11/21 0423     (approximate)  I have reviewed the triage vital signs and the nursing notes.   HISTORY  Chief Complaint Palpitations    HPI Steven Handel Sr. is a 60 y.o. male who presents to the ED from home with a chief complaint of heart palpitations.  Patient has a history of atrial fibrillation on Diltiazem and Eliquis.  Reports feeling palpitations last evening without associated chest pain or shortness of breath.  He has been instructed previously to take an extra 60 mg diltiazem in those cases which he did around 6 PM.  Currently feels like his symptoms have resolved.  Denies recent fever, cough, abdominal pain, nausea, vomiting or dizziness.  Denies recent travel or trauma.  Drinks caffeine free sodas.  Denies energy drinks, excessive peppermints or chocolate use.     Past Medical History:  Diagnosis Date   Diabetes mellitus without complication (HCC)    Hyperlipidemia    Paroxysmal atrial fibrillation [facility])     Patient Active Problem List   Diagnosis Date Noted   Hypertension associated with diabetes (HCC)    Paroxysmal atrial fibrillation with rapid ventricular response (HCC) 02/17/2021   Type 1 diabetes mellitus without complication (HCC)    Hyperlipidemia    Hypokalemia     Past Surgical History:  Procedure Laterality Date   BACK SURGERY     CARDIAC CATHETERIZATION  2007    Prior to Admission medications   Medication Sig Start Date End Date Taking? Authorizing Provider  apixaban (ELIQUIS) 5 MG TABS tablet Take 5 mg by mouth 2 (two) times daily.    [provider]  atorvastatin (LIPITOR) 20 MG tablet Take 20 mg by mouth at bedtime.    [provider]  diltiazem (CARDIZEM) 60 MG tablet Take 1 tablet (60 mg total) by mouth daily as needed. take 1 if you  feel heart palpitation, and if palpitation doesn't resolve 2 hours after you take it, then go to the ED. 02/18/21 02/18/22  Darlin Priestly, MD  diltiazem (DILACOR XR) 120 MG 24 hr capsule Take 120 mg by mouth daily.    [provider]  flecainide (TAMBOCOR) 50 MG tablet Take 50 mg by mouth 2 (two) times daily. 12/27/20   [provider]  hydrochlorothiazide (HYDRODIURIL) 12.5 MG tablet Take 12.5 mg by mouth daily.    [provider]  Insulin Human (INSULIN PUMP) 100 unit/ml SOLN Inject 1 each into the skin as directed. Replace insulin vial in pump every 3 days (patient uses Humalog)    [provider]  pantoprazole (PROTONIX) 40 MG tablet Take 1 tablet (40 mg total) by mouth daily. Patient not taking: No sig reported 06/27/18 02/18/21  Georgetta Haber, NP    Allergies Patient has no known allergies.  Family History  Problem Relation Age of Onset   Diabetes Mother    Stroke Mother    Heart disease Mother     Social History Social History   Tobacco Use   Smoking status: Never  Substance Use Topics   Alcohol use: No   Drug use: No    Review of Systems  Constitutional: No fever/chills Eyes: No visual changes. ENT: No sore throat. Cardiovascular: Positive for palpitations.  Denies chest pain. Respiratory: Denies shortness of breath. Gastrointestinal: No abdominal pain.  No nausea, no vomiting.  No diarrhea.  No constipation.  Genitourinary: Negative for dysuria. Musculoskeletal: Negative for back pain. Skin: Negative for rash. Neurological: Negative for headaches, focal weakness or numbness.   ____________________________________________   PHYSICAL EXAM:  VITAL SIGNS: ED Triage Vitals [04/10/21 2129]  Enc Vitals Group     BP 127/86     Pulse Rate 89     Resp 20     Temp 98.5 F (36.9 C)     Temp Source Oral     SpO2 98 %     Weight 175 lb (79.4 kg)     Height 6\' 4"  (1.93 m)     Head Circumference      Peak Flow      Pain Score 0      Pain Loc      Pain Edu?      Excl. in GC?     Constitutional: Alert and oriented. Well appearing and in no acute distress. Eyes: Conjunctivae are normal. PERRL. EOMI. Head: Atraumatic. Nose: No congestion/rhinnorhea. Mouth/Throat: Mucous membranes are moist.   Neck: No stridor.  No thyromegaly. Cardiovascular: Normal rate, irregular rhythm. Grossly normal heart sounds.  Good peripheral circulation. Respiratory: Normal respiratory effort.  No retractions. Lungs CTAB. Gastrointestinal: Soft and nontender. No distention. No abdominal bruits. No CVA tenderness. Musculoskeletal: No lower extremity tenderness nor edema.  No joint effusions. Neurologic:  Normal speech and language. No gross focal neurologic deficits are appreciated. No gait instability. Skin:  Skin is warm, dry and intact. No rash noted. Psychiatric: Mood and affect are normal. Speech and behavior are normal.  ____________________________________________   LABS (all labs ordered are listed, but only abnormal results are displayed)  Labs Reviewed  COMPREHENSIVE METABOLIC PANEL - Abnormal; Notable for the following components:      Result Value   Glucose, Bld 313 (*)    Calcium 8.7 (*)    All other components within normal limits  CBC  T4, FREE  TSH  TROPONIN I (HIGH SENSITIVITY)  TROPONIN I (HIGH SENSITIVITY)   ____________________________________________  EKG  ED ECG REPORT I, Bennette Hasty J, the attending physician, personally viewed and interpreted this ECG.   Date: 04/11/2021  EKG Time: 2131  Rate: 102  Rhythm: atrial fibrillation, rate 102  Axis: RAD  Intervals:right bundle branch block  ST&T Change: Nonspecific  ____________________________________________  RADIOLOGY I, Trystyn Sitts J, personally viewed and evaluated these images (plain radiographs) as part of my medical decision making, as well as reviewing the written report by the radiologist.  ED MD interpretation:  No acute cardiopulmonary  process  Official radiology report(s): DG Chest 2 View  Result Date: 04/11/2021 CLINICAL DATA:  Palpitations. EXAM: CHEST - 2 VIEW COMPARISON:  February 17, 2021 FINDINGS: The heart size and mediastinal contours are within normal limits. Both lungs are clear. The visualized skeletal structures are unremarkable. IMPRESSION: No active cardiopulmonary disease. Electronically Signed   By: February 19, 2021 M.D.   On: 04/11/2021 00:04    ____________________________________________   PROCEDURES  Procedure(s) performed (including Critical Care):  .1-3 Lead EKG Interpretation  Date/Time: 04/11/2021 5:11 AM Performed by: 04/13/2021, MD Authorized by: Irean Hong, MD     Interpretation: abnormal     ECG rate:  82   ECG rate assessment: normal     Rhythm: atrial fibrillation     Ectopy: none     Conduction: normal   Comments:     Patient placed on cardiac monitor to evaluate for arrthymias   ____________________________________________   INITIAL IMPRESSION / ASSESSMENT AND PLAN /  ED COURSE  As part of my medical decision making, I reviewed the following data within the electronic MEDICAL RECORD NUMBER Nursing notes reviewed and incorporated, Labs reviewed, EKG interpreted, Old chart reviewed, Radiograph reviewed, and Notes from prior ED visits     60 year old male presenting with palpitations. Differential diagnosis includes, but is not limited to, ACS, aortic dissection, pulmonary embolism, cardiac tamponade, pneumothorax, pneumonia, pericarditis, myocarditis, GI-related causes including esophagitis/gastritis, and musculoskeletal chest wall pain.      Clinical Course as of 04/11/21 0512  Tue Apr 11, 2021  1224 Repeat vital signs demonstrate rate controlled atrial fibrillation.  Patient has diltiazem at home and has been instructed to take an extra 60 mg as needed.  He will follow-up with his cardiologist in [facility].  Strict return precautions given.  Patient verbalizes understanding and  agrees with plan of care. [JS]    Clinical Course User Index [JS] Irean Hong, MD     ____________________________________________   FINAL CLINICAL IMPRESSION(S) / ED DIAGNOSES  Final diagnoses:  Heart palpitations  Atrial fibrillation with RVR [facility])     ED Discharge Orders     None        Note:  This document was prepared using Dragon voice recognition software and may include unintentional dictation errors.    Irean Hong, MD 04/11/21 [phone]

## 2021-05-31 ENCOUNTER — Telehealth: Payer: Self-pay

## 2021-05-31 NOTE — Telephone Encounter (Signed)
NOTES SCANNED TO REFERRAL 

## 2021-06-19 ENCOUNTER — Other Ambulatory Visit: Payer: Self-pay

## 2021-06-19 ENCOUNTER — Telehealth: Payer: Self-pay | Admitting: *Deleted

## 2021-06-19 ENCOUNTER — Ambulatory Visit: Payer: 59 | Admitting: Cardiology

## 2021-06-19 ENCOUNTER — Encounter: Payer: Self-pay | Admitting: Cardiology

## 2021-06-19 VITALS — BP 118/64 | HR 64 | Ht 76.0 in | Wt 179.0 lb

## 2021-06-19 DIAGNOSIS — Z01818 Encounter for other preprocedural examination: Secondary | ICD-10-CM | POA: Diagnosis not present

## 2021-06-19 DIAGNOSIS — I4819 Other persistent atrial fibrillation: Secondary | ICD-10-CM | POA: Diagnosis not present

## 2021-06-19 DIAGNOSIS — Z01812 Encounter for preprocedural laboratory examination: Secondary | ICD-10-CM

## 2021-06-19 NOTE — Progress Notes (Signed)
Electrophysiology Office Note   Date:  06/19/2021   ID:  Steven Keith Sr., DOB November 05, 1960, MRN [id number]  PCP:  Shirline Frees, MD  Cardiologist:  Lennice Sites Primary Electrophysiologist:  [name] [name], MD    Chief Complaint: AF   History of Present Illness: Steven Reveron Sr. is a 60 y.o. male who is being seen today for the evaluation of AF at the request of [name], PA-C. Presenting today for electrophysiology evaluation.  He has a history significant for atrial fibrillation, type 1 diabetes, hypertension, hyperlipidemia.  He was diagnosed with atrial fibrillation 2 years ago.  When he is in atrial fibrillation, he feels palpitations.  He does not have much in the way of fatigue or shortness of breath.  He is generally able to do all of his daily activities.  He did present to the hospital July 2022 in atrial fibrillation.  This required cardioversion.  He was previously on flecainide 50 mg which is increased to 100 mg.  He has not had any further episodes of atrial fibrillation since his hospital stay.  Today, he denies symptoms of palpitations, chest pain, shortness of breath, orthopnea, PND, lower extremity edema, claudication, dizziness, presyncope, syncope, bleeding, or neurologic sequela. The patient is tolerating medications without difficulties.    Past Medical History:  Diagnosis Date   Abnormal EKG    Chest pain    COVID    Diabetes mellitus without complication (HCC)    HTN (hypertension)    Hyperlipidemia    LVH (left ventricular hypertrophy) due to hypertensive disease    OSA on CPAP    Palpitations    Paroxysmal atrial fibrillation ([place])    Shift work sleep disorder    Somnolence    Past Surgical History:  Procedure Laterality Date   BACK SURGERY     CARDIAC CATHETERIZATION  2007     Current Outpatient Medications  Medication Sig Dispense Refill   apixaban (ELIQUIS) 5 MG TABS tablet Take 5 mg by mouth 2 (two) times daily.      atorvastatin (LIPITOR) 20 MG tablet Take 20 mg by mouth at bedtime.     diltiazem (CARDIZEM) 60 MG tablet Take 1 tablet (60 mg total) by mouth daily as needed. take 1 if you feel heart palpitation, and if palpitation doesn't resolve 2 hours after you take it, then go to the ED. 2 tablet 0   diltiazem (DILACOR XR) 120 MG 24 hr capsule Take 120 mg by mouth daily.     flecainide (TAMBOCOR) 50 MG tablet Take 50 mg by mouth 2 (two) times daily.     hydrochlorothiazide (HYDRODIURIL) 12.5 MG tablet Take 12.5 mg by mouth daily.     Insulin Human (INSULIN PUMP) 100 unit/ml SOLN Inject 1 each into the skin as directed. Replace insulin vial in pump every 3 days (patient uses Humalog)     No current facility-administered medications for this visit.    Allergies:   Patient has no known allergies.   Social History:  The patient  reports that he has never smoked. He has never used smokeless tobacco. He reports that he does not drink alcohol and does not use drugs.   Family History:  The patient's family history includes Diabetes in his mother; Heart disease in his mother; Stroke in his mother.    ROS:  Please see the history of present illness.   Otherwise, review of systems is positive for none.   All other systems are reviewed and negative.  PHYSICAL EXAM: VS:  BP 118/64   Pulse 64   Ht 6\' 4"  (1.93 m)   Wt 179 lb (81.2 kg)   SpO2 98%   BMI 21.79 kg/m  , BMI Body mass index is 21.79 kg/m. GEN: Well nourished, well developed, in no acute distress  HEENT: normal  Neck: no JVD, carotid bruits, or masses Cardiac: RRR; no murmurs, rubs, or gallops,no edema  Respiratory:  clear to auscultation bilaterally, normal work of breathing GI: soft, nontender, nondistended, + BS MS: no deformity or atrophy  Skin: warm and dry Neuro:  Strength and sensation are intact Psych: euthymic mood, full affect  EKG:  EKG is ordered today. Personal review of the ekg ordered shows sinus rhythm, rate 64,  first-degree AV block    Recent Labs: 02/18/2021: Magnesium 2.0 04/10/2021: ALT 23; BUN 16; Creatinine, Ser 1.02; Hemoglobin 14.8; Platelets 204; Potassium 4.0; Sodium 138 04/11/2021: TSH 1.717    Lipid Panel  No results found for: CHOL, TRIG, HDL, CHOLHDL, VLDL, LDLCALC, LDLDIRECT   Wt Readings from Last 3 Encounters:  06/19/21 179 lb (81.2 kg)  04/10/21 175 lb (79.4 kg)  02/17/21 175 lb (79.4 kg)      Other studies Reviewed: Additional studies/ records that were reviewed today include: TTE 03/06/21  Review of the above records today demonstrates:  Ejection fraction 55 to 60% Left atrium normal in size and function Right ventricle normal in size and function Structurally normal aortic and mitral valves without stenosis or regurgitation  ASSESSMENT AND PLAN:  1.  Persistent atrial fibrillation currently on Eliquis 5 mg twice daily, diltiazem 120 mg daily, flecainide 100 mg twice daily.  CHA2DS2-VASc of 3.  He is continue to have episodes of atrial fibrillation despite lower dose of flecainide.  He has been increased, though he would like to get off antiarrhythmic medications.  He has had a cardioversion in the past.  Due to that, we Uniqua Kihn plan for ablation.  Risk, benefits, and alternatives to EP study and radiofrequency ablation for afib were also discussed in detail today. These risks include but are not limited to stroke, bleeding, vascular damage, tamponade, perforation, damage to the esophagus, lungs, and other structures, pulmonary vein stenosis, worsening renal function, and death. The patient understands these risk and wishes to proceed.  We Derelle Cockrell therefore proceed with catheter ablation at the next available time.  Carto, ICE, anesthesia are requested for the procedure.  Melvern Ramone also obtain CT PV protocol prior to the procedure to exclude LAA thrombus and further evaluate atrial anatomy.  2.  Hypertension: Currently well controlled.  Plan per primary cardiology.  3.   Hyperlipidemia: Continue atorvastatin 20 mg per primary cardiology  4.  Diabetes.  Type I on insulin pump  Case discussed with primary cardiology  Current medicines are reviewed at length with the patient today.   The patient does not have concerns regarding his medicines.  The following changes were made today:  none  Labs/ tests ordered today include:  Orders Placed This Encounter  Procedures   CT CARDIAC MORPH/PULM VEIN W/CM&W/O CA SCORE   Basic metabolic panel   CBC   EKG 12-Lead     Disposition:   FU with Kino Dunsworth 3 months  Signed, Emmely Bittinger 03/08/21, MD  06/19/2021 9:36 AM     [facility] [address] [place] [phone] (office) [phone] (fax);

## 2021-06-19 NOTE — Patient Instructions (Signed)
Medication Instructions:  Your physician recommends that you continue on your current medications as directed. Please refer to the Current Medication list given to you today.  *If you need a refill on your cardiac medications before your next appointment, please call your pharmacy*   Lab Work: Pre procedure labs 08/15/2021:  BMP & CBC You do NOT need to be fasting for this.  You can stop by the office anytime between 7:30 am - 4:30 pm.   If you have labs (blood work) drawn today and your tests are completely normal, you will receive your results only by: [place] (if you have MyChart) OR A paper copy in the mail If you have any lab test that is abnormal or we need to change your treatment, we will call you to review the results.   Testing/Procedures: Your physician has requested that you have cardiac CT within 7 days PRIOR to your ablation. Cardiac computed tomography (CT) is a painless test that uses an x-ray machine to take clear, detailed pictures of your heart.  Please follow instruction below located under "other instructions". You will get a call from our office to schedule the date for this test.  Your physician has recommended that you have an ablation. Catheter ablation is a medical procedure used to treat some cardiac arrhythmias (irregular heartbeats). During catheter ablation, a long, thin, flexible tube is put into a blood vessel in your groin (upper thigh), or neck. This tube is called an ablation catheter. It is then guided to your heart through the blood vessel. Radio frequency waves destroy small areas of heart tissue where abnormal heartbeats may cause an arrhythmia to start. Please follow instruction below located under "other instructions".   Follow-Up: At [facility], you and your health needs are our priority.  As part of our continuing mission to provide you with exceptional heart care, we have created designated Provider Care Teams.  These Care Teams include  your primary Cardiologist (physician) and Advanced Practice Providers (APPs -  Physician Assistants and Nurse Practitioners) who all work together to provide you with the care you need, when you need it.    Your next appointment:   1 month(s) after your ablation  The format for your next appointment:   In Person  Provider:   AFib clinic   Thank you for choosing [facility]!!   Trinidad Curet, RN [phone]    Other Instructions   CT INSTRUCTIONS Your cardiac CT will be scheduled at:  [facility] [address] [place], [place] [address] [phone]  Please arrive at the [facility] main entrance of [facility] 30 minutes prior to test start time. Proceed to the [facility] Radiology Department (first floor) to check-in and test prep.   Please follow these instructions carefully (unless otherwise directed):  Hold all erectile dysfunction medications at least 3 days (72 hrs) prior to test.  On the Night Before the Test: Be sure to Drink plenty of water. Do not consume any caffeinated/decaffeinated beverages or chocolate 12 hours prior to your test. Do not take any antihistamines 12 hours prior to your test.  On the Day of the Test: Drink plenty of water until 1 hour prior to the test. Do not eat any food 4 hours prior to the test. You may take your regular medications prior to the test.  Take your as needed Diltiazem 60 mg the morning of this test (along with your daily dose of 120 mg) HOLD Furosemide/Hydrochlorothiazide morning  of the test.      After the Test: Drink plenty of water. After receiving IV contrast, you may experience a mild flushed feeling. This is normal. On occasion, you may experience a mild rash up to 24 hours after the test. This is not dangerous. If this occurs, you can take Benadryl 25 mg and increase your fluid intake. If you experience trouble breathing, this can be serious. If it is severe call 911 IMMEDIATELY. If  it is mild, please call our office. If you take any of these medications: Glipizide/Metformin, Avandament, Glucavance, please do not take 48 hours after completing test unless otherwise instructed.   Once we have confirmed authorization from your insurance company, we will call you to set up a date and time for your test. Based on how quickly your insurance processes prior authorizations requests, please allow up to 4 weeks to be contacted for scheduling your Cardiac CT appointment. Be advised that routine Cardiac CT appointments could be scheduled as many as 8 weeks after your provider has ordered it.  For non-scheduling related questions, please contact the cardiac imaging nurse navigator should you have any questions/concerns: Rockwell Alexandria, Cardiac Imaging Nurse Navigator Larey Brick, Cardiac Imaging Nurse Navigator [facility] Direct Office Dial: [phone]   For scheduling needs, including cancellations and rescheduling, please call [place], [phone].      Electrophysiology/Ablation Procedure Instructions   You are scheduled for a(n)  ablation on 08/28/2021 with Dr. Loman Brooklyn.   1.   Pre procedure testing-             A.  LAB WORK --- On 08/15/2020 for your pre procedure blood work.  You do NOT need to be fasting.  You can stop by the office anytime between 7:30 am - 4:30 pm   On the day of your procedure 08/28/2021 you will go to [facility] ([address]) at 5:30 am.  Bonita Quin will go to the main entrance [employer]) and enter where the [employer] are.  Your driver will drop you off and you will head down the hallway to ADMITTING.  You may have one support person come in to the hospital with you.  They will be asked to wait in the waiting room. It is OK to have someone drop you off and come back when you are ready to be discharged.   3.   Do not eat or drink after midnight prior to your procedure.   4.   On the morning of  your procedure do NOT take any medication. Do not miss any doses of your blood thinner prior to the morning of your procedure or your procedure will need to be rescheduled.   5.  Plan for an overnight stay but you may be discharged after your procedure, if you use your phone frequently bring your phone charger. If you are discharged after your procedure you will need someone to drive you home and be with you for 24 hours after your procedure.   6. You will follow up with the AFIB clinic 4 weeks after your procedure.  You will follow up with Dr. Elberta Fortis  3 months after your procedure.  These appointments will be made for you.   7. FYI: For your safety, and to allow [place] to monitor your vital signs accurately during the surgery/procedure we request that if you have artificial nails, gel coating, SNS etc. Please have those removed prior to your surgery/procedure. Not  having the nail coverings /polish removed may result in cancellation or delay of your surgery/procedure.  * If you have ANY questions please call the office [phone] and ask for Dvaughn Fickle RN or send me a MyChart message   * Occasionally, EP Studies and ablations can become lengthy.  Please make your family aware of this before your procedure starts.  Average time ranges from 2-8 hours for EP studies/ablations.  Your physician will call your family after the procedure with the results.                                    Cardiac Ablation Cardiac ablation is a procedure to destroy (ablate) some heart tissue that is sending bad signals. These bad signals cause problems in heart rhythm. The heart has many areas that make these signals. If there are problems in these areas, they can make the heart beat in a way that is not normal. Destroying some tissues can help make the heart rhythm normal. Tell your doctor about: Any allergies you have. All medicines you are taking. These include vitamins, herbs, eye drops, creams, and over-the-counter  medicines. Any problems you or family members have had with medicines that make you fall asleep (anesthetics). Any blood disorders you have. Any surgeries you have had. Any medical conditions you have, such as kidney failure. Whether you are pregnant or may be pregnant. What are the risks? This is a safe procedure. But problems may occur, including: Infection. Bruising and bleeding. Bleeding into the chest. Stroke or blood clots. Damage to nearby areas of your body. Allergies to medicines or dyes. The need for a pacemaker if the normal system is damaged. Failure of the procedure to treat the problem. What happens before the procedure? Medicines Ask your doctor about: Changing or stopping your normal medicines. This is important. Taking aspirin and ibuprofen. Do not take these medicines unless your doctor tells you to take them. Taking other medicines, vitamins, herbs, and supplements. General instructions Follow instructions from your doctor about what you cannot eat or drink. Plan to have someone take you home from the hospital or clinic. If you will be going home right after the procedure, plan to have someone with you for 24 hours. Ask your doctor what steps will be taken to prevent infection. What happens during the procedure?  An IV tube will be put into one of your veins. You will be given a medicine to help you relax. The skin on your neck or groin will be numbed. A cut (incision) will be made in your neck or groin. A needle will be put through your cut and into a large vein. A tube (catheter) will be put into the needle. The tube will be moved to your heart. Dye may be put through the tube. This helps your doctor see your heart. Small devices (electrodes) on the tube will send out signals. A type of energy will be used to destroy some heart tissue. The tube will be taken out. Pressure will be held on your cut. This helps stop bleeding. A bandage will be put over your  cut. The exact procedure may vary among doctors and hospitals. What happens after the procedure? You will be watched until you leave the hospital or clinic. This includes checking your heart rate, breathing rate, oxygen, and blood pressure. Your cut will be watched for bleeding. You will need to lie still for a  few hours. Do not drive for 24 hours or as long as your doctor tells you. Summary Cardiac ablation is a procedure to destroy some heart tissue. This is done to treat heart rhythm problems. Tell your doctor about any medical conditions you may have. Tell him or her about all medicines you are taking to treat them. This is a safe procedure. But problems may occur. These include infection, bruising, bleeding, and damage to nearby areas of your body. Follow what your doctor tells you about food and drink. You may also be told to change or stop some of your medicines. After the procedure, do not drive for 24 hours or as long as your doctor tells you. This information is not intended to replace advice given to you by your health care provider. Make sure you discuss any questions you have with your health care provider. Document Revised: 07/02/2019 Document Reviewed: 07/02/2019 Elsevier Patient Education  2022 [employer].

## 2021-06-19 NOTE — Telephone Encounter (Signed)
Hi Good Morning,  PT is wanting the  8:30am appt on 01/16

## 2021-08-15 ENCOUNTER — Other Ambulatory Visit: Payer: 59 | Admitting: *Deleted

## 2021-08-15 ENCOUNTER — Other Ambulatory Visit: Payer: Self-pay

## 2021-08-15 DIAGNOSIS — Z01812 Encounter for preprocedural laboratory examination: Secondary | ICD-10-CM

## 2021-08-15 DIAGNOSIS — I4819 Other persistent atrial fibrillation: Secondary | ICD-10-CM

## 2021-08-15 LAB — BASIC METABOLIC PANEL
BUN/Creatinine Ratio: 15 (ref 10–24)
BUN: 15 mg/dL (ref 8–27)
CO2: 27 mmol/L (ref 20–29)
Calcium: 8.8 mg/dL (ref 8.6–10.2)
Chloride: 100 mmol/L (ref 96–106)
Creatinine, Ser: 0.99 mg/dL (ref 0.76–1.27)
Glucose: 348 mg/dL — ABNORMAL HIGH (ref 70–99)
Potassium: 4.3 mmol/L (ref 3.5–5.2)
Sodium: 136 mmol/L (ref 134–144)
eGFR: 87 mL/min/{1.73_m2} (ref >59)

## 2021-08-15 LAB — CBC
Hematocrit: 40.2 % (ref 37.5–51.0)
Hemoglobin: 13.1 g/dL (ref 13.0–17.7)
MCH: 30.7 pg (ref 26.6–33.0)
MCHC: 32.6 g/dL (ref 31.5–35.7)
MCV: 94 fL (ref 79–97)
Platelets: 216 10*3/uL (ref 150–450)
RBC: 4.27 x10E6/uL (ref 4.14–5.80)
RDW: 11.7 % (ref 11.6–15.4)
WBC: 4.7 10*3/uL (ref 3.4–10.8)

## 2021-08-18 ENCOUNTER — Telehealth (HOSPITAL_COMMUNITY): Payer: Self-pay | Admitting: *Deleted

## 2021-08-18 NOTE — Telephone Encounter (Signed)
Attempted to call patient regarding upcoming cardiac CT appointment. °Left message on voicemail with name and callback number ° °Alissa Pharr RN Navigator Cardiac Imaging °[facility] °[phone] Office °[phone] Cell ° °

## 2021-08-21 ENCOUNTER — Ambulatory Visit (HOSPITAL_COMMUNITY)
Admission: RE | Admit: 2021-08-21 | Discharge: 2021-08-21 | Disposition: A | Discharge status: Home / Self Care | Payer: 59 | Source: Ambulatory Visit | Attending: Cardiology | Admitting: Cardiology

## 2021-08-21 ENCOUNTER — Other Ambulatory Visit: Payer: Self-pay

## 2021-08-21 DIAGNOSIS — I4819 Other persistent atrial fibrillation: Secondary | ICD-10-CM | POA: Insufficient documentation

## 2021-08-21 MED ORDER — IOHEXOL 350 MG/ML SOLN
80.0000 mL | Freq: Once | INTRAVENOUS | Status: AC | PRN
  Administered 2021-08-21: 80 mL via INTRAVENOUS

## 2021-08-23 NOTE — Telephone Encounter (Signed)
Left message to call back  (Need to inform to arrive at 7:30 am on Monday for his procedure)

## 2021-08-24 ENCOUNTER — Telehealth: Payer: Self-pay | Admitting: Cardiology

## 2021-08-24 NOTE — Telephone Encounter (Signed)
Patient was returning a call to Sherri to go over instructions for his upcoming Ablation procedure. The patient can get messages through MyChart if that is easier for the office

## 2021-08-24 NOTE — Telephone Encounter (Signed)
Left detailed message for pt that I would send instructions via mychart

## 2021-08-25 NOTE — Pre-Procedure Instructions (Signed)
Instructed patient on the following items: Arrival time 0730 Nothing to eat or drink after midnight No meds AM of procedure Responsible person to drive you home and stay with you for 24 hrs  Have you missed any doses of anti-coagulant Eliqus- hasn't missed any doses

## 2021-08-28 ENCOUNTER — Other Ambulatory Visit: Payer: Self-pay

## 2021-08-28 ENCOUNTER — Encounter (HOSPITAL_COMMUNITY)
Admission: RE | Disposition: A | Discharge status: Home / Self Care | Payer: Self-pay | Source: Home / Self Care | Attending: Cardiology

## 2021-08-28 ENCOUNTER — Ambulatory Visit (HOSPITAL_COMMUNITY)
Admission: RE | Admit: 2021-08-28 | Discharge: 2021-08-28 | Disposition: A | Discharge status: Home / Self Care | Payer: 59 | Attending: Cardiology | Admitting: Cardiology

## 2021-08-28 ENCOUNTER — Ambulatory Visit (HOSPITAL_COMMUNITY): Payer: 59 | Admitting: Anesthesiology

## 2021-08-28 DIAGNOSIS — Z794 Long term (current) use of insulin: Secondary | ICD-10-CM | POA: Insufficient documentation

## 2021-08-28 DIAGNOSIS — E785 Hyperlipidemia, unspecified: Secondary | ICD-10-CM | POA: Diagnosis not present

## 2021-08-28 DIAGNOSIS — I4819 Other persistent atrial fibrillation: Secondary | ICD-10-CM | POA: Diagnosis present

## 2021-08-28 DIAGNOSIS — I10 Essential (primary) hypertension: Secondary | ICD-10-CM | POA: Diagnosis not present

## 2021-08-28 DIAGNOSIS — E109 Type 1 diabetes mellitus without complications: Secondary | ICD-10-CM | POA: Insufficient documentation

## 2021-08-28 HISTORY — PX: ATRIAL FIBRILLATION ABLATION: EP1191

## 2021-08-28 LAB — GLUCOSE, CAPILLARY
Glucose-Capillary: 111 mg/dL — ABNORMAL HIGH (ref 70–99)
Glucose-Capillary: 224 mg/dL — ABNORMAL HIGH (ref 70–99)

## 2021-08-28 LAB — POCT ACTIVATED CLOTTING TIME
Activated Clotting Time: 287 seconds
Activated Clotting Time: 311 seconds

## 2021-08-28 SURGERY — ATRIAL FIBRILLATION ABLATION
Anesthesia: General

## 2021-08-28 MED ORDER — ACETAMINOPHEN 325 MG PO TABS
650.0000 mg | ORAL_TABLET | ORAL | Status: DC | PRN
  Administered 2021-08-28: 650 mg via ORAL
  Filled 2021-08-28: qty 2

## 2021-08-28 MED ORDER — MIDAZOLAM HCL 5 MG/5ML IJ SOLN
INTRAMUSCULAR | Status: DC | PRN
  Administered 2021-08-28: 1 mg via INTRAVENOUS

## 2021-08-28 MED ORDER — SODIUM CHLORIDE 0.9 % IV SOLN: 250.0000 mL | INTRAVENOUS | Status: DC | PRN

## 2021-08-28 MED ORDER — ONDANSETRON HCL 4 MG/2ML IJ SOLN
INTRAMUSCULAR | Status: DC | PRN
Start: 2021-08-28 — End: 2021-08-28
  Administered 2021-08-28: 4 mg via INTRAVENOUS

## 2021-08-28 MED ORDER — FENTANYL CITRATE (PF) 100 MCG/2ML IJ SOLN
INTRAMUSCULAR | Status: DC | PRN
  Administered 2021-08-28: 100 ug via INTRAVENOUS

## 2021-08-28 MED ORDER — PHENYLEPHRINE HCL-NACL 20-0.9 MG/250ML-% IV SOLN
INTRAVENOUS | Status: DC | PRN
  Administered 2021-08-28: 20 ug/min via INTRAVENOUS

## 2021-08-28 MED ORDER — SODIUM CHLORIDE 0.9% FLUSH: 3.0000 mL | INTRAVENOUS | Status: DC | PRN

## 2021-08-28 MED ORDER — SUGAMMADEX SODIUM 200 MG/2ML IV SOLN
INTRAVENOUS | Status: DC | PRN
  Administered 2021-08-28: 200 mg via INTRAVENOUS

## 2021-08-28 MED ORDER — HEPARIN (PORCINE) IN NACL 1000-0.9 UT/500ML-% IV SOLN
INTRAVENOUS | Status: DC | PRN
  Administered 2021-08-28 (×5): 500 mL

## 2021-08-28 MED ORDER — SODIUM CHLORIDE 0.9 % IV SOLN: INTRAVENOUS | Status: DC

## 2021-08-28 MED ORDER — PHENYLEPHRINE 40 MCG/ML (10ML) SYRINGE FOR IV PUSH (FOR BLOOD PRESSURE SUPPORT)
PREFILLED_SYRINGE | INTRAVENOUS | Status: DC | PRN
  Administered 2021-08-28 (×2): 80 ug via INTRAVENOUS

## 2021-08-28 MED ORDER — FENTANYL CITRATE (PF) 100 MCG/2ML IJ SOLN
INTRAMUSCULAR | Status: AC
  Filled 2021-08-28: qty 2

## 2021-08-28 MED ORDER — HEPARIN SODIUM (PORCINE) 1000 UNIT/ML IJ SOLN
INTRAMUSCULAR | Status: DC | PRN
  Administered 2021-08-28: 1000 [IU] via INTRAVENOUS

## 2021-08-28 MED ORDER — LIDOCAINE 2% (20 MG/ML) 5 ML SYRINGE
INTRAMUSCULAR | Status: DC | PRN
  Administered 2021-08-28: 80 mg via INTRAVENOUS

## 2021-08-28 MED ORDER — DEXAMETHASONE SODIUM PHOSPHATE 10 MG/ML IJ SOLN
INTRAMUSCULAR | Status: DC | PRN
  Administered 2021-08-28: 5 mg via INTRAVENOUS

## 2021-08-28 MED ORDER — SODIUM CHLORIDE 0.9% FLUSH: 3.0000 mL | Freq: Two times a day (BID) | INTRAVENOUS | Status: DC

## 2021-08-28 MED ORDER — DOBUTAMINE INFUSION FOR EP/ECHO/NUC (1000 MCG/ML)
INTRAVENOUS | Status: DC | PRN
  Administered 2021-08-28: 20 ug/kg/min via INTRAVENOUS

## 2021-08-28 MED ORDER — ROCURONIUM BROMIDE 10 MG/ML (PF) SYRINGE
PREFILLED_SYRINGE | INTRAVENOUS | Status: DC | PRN
  Administered 2021-08-28: 70 mg via INTRAVENOUS

## 2021-08-28 MED ORDER — MIDAZOLAM HCL 5 MG/5ML IJ SOLN
INTRAMUSCULAR | Status: AC
  Filled 2021-08-28: qty 5

## 2021-08-28 MED ORDER — ONDANSETRON HCL 4 MG/2ML IJ SOLN: 4.0000 mg | Freq: Four times a day (QID) | INTRAMUSCULAR | Status: DC | PRN

## 2021-08-28 MED ORDER — PROPOFOL 10 MG/ML IV BOLUS
INTRAVENOUS | Status: DC | PRN
  Administered 2021-08-28: 160 mg via INTRAVENOUS

## 2021-08-28 MED ORDER — PROTAMINE SULFATE 10 MG/ML IV SOLN
INTRAVENOUS | Status: DC | PRN
  Administered 2021-08-28: 40 mg via INTRAVENOUS

## 2021-08-28 MED ORDER — HEPARIN SODIUM (PORCINE) 1000 UNIT/ML IJ SOLN
INTRAMUSCULAR | Status: DC | PRN
  Administered 2021-08-28: 2000 [IU] via INTRAVENOUS
  Administered 2021-08-28: 14000 [IU] via INTRAVENOUS
  Administered 2021-08-28: 4000 [IU] via INTRAVENOUS

## 2021-08-28 SURGICAL SUPPLY — 19 items
BAG SNAP BAND KOVER 36X36 (MISCELLANEOUS) ×1 IMPLANT
CATH OCTARAY 2.0 F 3-3-3-3-3 (CATHETERS) ×1 IMPLANT
CATH S CIRCA THERM PROBE 10F (CATHETERS) ×1 IMPLANT
CATH SMTCH THERMOCOOL SF DF (CATHETERS) ×1 IMPLANT
CATH SOUNDSTAR ECO 8FR (CATHETERS) ×1 IMPLANT
CATH WEBSTER BI DIR CS D-F CRV (CATHETERS) ×1 IMPLANT
CLOSURE PERCLOSE PROSTYLE (VASCULAR PRODUCTS) ×4 IMPLANT
COVER SWIFTLINK CONNECTOR (BAG) ×3 IMPLANT
KIT CATH VERSACROSS STEERABLE (CATHETERS) ×1 IMPLANT
PACK EP LATEX FREE (CUSTOM PROCEDURE TRAY) ×2
PACK EP LF (CUSTOM PROCEDURE TRAY) ×2 IMPLANT
PAD DEFIB RADIO PHYSIO CONN (PAD) ×3 IMPLANT
PATCH CARTO3 (PAD) ×1 IMPLANT
SHEATH CARTO VIZIGO SM CVD (SHEATH) ×1 IMPLANT
SHEATH PINNACLE 7F 10CM (SHEATH) ×2 IMPLANT
SHEATH PINNACLE 8F 10CM (SHEATH) ×4 IMPLANT
SHEATH PINNACLE 9F 10CM (SHEATH) ×1 IMPLANT
SHEATH PROBE COVER 6X72 (BAG) ×1 IMPLANT
TUBING SMART ABLATE COOLFLOW (TUBING) ×1 IMPLANT

## 2021-08-28 NOTE — H&P (Signed)
Electrophysiology Office Note   Date:  08/28/2021   ID:  Steven Carwin Sr., DOB August 10, 1961, MRN [id number]  PCP:  Shirline Frees, MD  Cardiologist:  Lennice Sites Primary Electrophysiologist:  Evanna Washinton [name], MD    Chief Complaint: AF   History of Present Illness: Steven Rund Sr. is a 60 y.o. male who is being seen today for the evaluation of AF at the request of No ref. provider found. Presenting today for electrophysiology evaluation.  He has a history significant for atrial fibrillation, type 1 diabetes, hypertension, hyperlipidemia.  He was diagnosed with atrial fibrillation 2 years ago.  When he is in atrial fibrillation, he feels palpitations.  He does not have much in the way of fatigue or shortness of breath.  He is generally able to do all of his daily activities.  He did present to the hospital July 2022 in atrial fibrillation.  This required cardioversion.  He was previously on flecainide 50 mg which is increased to 100 mg.  He has not had any further episodes of atrial fibrillation since his hospital stay.  Today, denies symptoms of palpitations, chest pain, shortness of breath, orthopnea, PND, lower extremity edema, claudication, dizziness, presyncope, syncope, bleeding, or neurologic sequela. The patient is tolerating medications without difficulties. Plan ablation today.    Past Medical History:  Diagnosis Date   Abnormal EKG    Chest pain    COVID    Diabetes mellitus without complication ([place])    HTN (hypertension)    Hyperlipidemia    LVH (left ventricular hypertrophy) due to hypertensive disease    OSA on CPAP    Palpitations    Paroxysmal atrial fibrillation ([place])    Shift work sleep disorder    Somnolence    Past Surgical History:  Procedure Laterality Date   BACK SURGERY     CARDIAC CATHETERIZATION  2007     Current Facility-Administered Medications  Medication Dose Route Frequency Provider Last Rate Last Admin   0.9 %  sodium chloride  infusion   Intravenous Continuous Constance Haw, MD 50 mL/hr at 08/28/21 0801 New Bag at 08/28/21 0801    Allergies:   Patient has no known allergies.   Social History:  The patient  reports that he has never smoked. He has never used smokeless tobacco. He reports that he does not drink alcohol and does not use drugs.   Family History:  The patient's family history includes Diabetes in his mother; Heart disease in his mother; Stroke in his mother.   ROS:  Please see the history of present illness.   Otherwise, review of systems is positive for none.   All other systems are reviewed and negative.   PHYSICAL EXAM: VS:  BP (!) 142/77    Pulse (!) 58    Temp 98.1 F (36.7 C) (Oral)    Ht 6\' 4"  (1.93 m)    Wt 81.6 kg    SpO2 97%    BMI 21.91 kg/m  , BMI Body mass index is 21.91 kg/m. GEN: Well nourished, well developed, in no acute distress  HEENT: normal  Neck: no JVD, carotid bruits, or masses Cardiac: irregular; no murmurs, rubs, or gallops,no edema  Respiratory:  clear to auscultation bilaterally, normal work of breathing GI: soft, nontender, nondistended, + BS MS: no deformity or atrophy  Skin: warm and dry Neuro:  Strength and sensation are intact Psych: euthymic mood, full affect  Recent Labs: 02/18/2021: Magnesium 2.0 04/10/2021: ALT 23 04/11/2021: TSH 1.717 08/15/2021: BUN  15; Creatinine, Ser 0.99; Hemoglobin 13.1; Platelets 216; Potassium 4.3; Sodium 136    Lipid Panel  No results found for: CHOL, TRIG, HDL, CHOLHDL, VLDL, LDLCALC, LDLDIRECT   Wt Readings from Last 3 Encounters:  08/28/21 81.6 kg  06/19/21 81.2 kg  04/10/21 79.4 kg      Other studies Reviewed: Additional studies/ records that were reviewed today include: TTE 03/06/21  Review of the above records today demonstrates:  Ejection fraction 55 to 60% Left atrium normal in size and function Right ventricle normal in size and function Structurally normal aortic and mitral valves without stenosis or  regurgitation  ASSESSMENT AND PLAN:  1.  Persistent atrial fibrillation: Steven Reading Sr. has presented today for surgery, with the diagnosis of AF.  The various methods of treatment have been discussed with the patient and family. After consideration of risks, benefits and other options for treatment, the patient has consented to  Procedure(s): Catheter ablation as a surgical intervention .  Risks include but not limited to complete heart block, stroke, esophageal damage, nerve damage, bleeding, vascular damage, tamponade, perforation, MI, and death. The patient's history has been reviewed, patient examined, no change in status, stable for surgery.  I have reviewed the patient's chart and labs.  Questions were answered to the patient's satisfaction.    Enola Siebers Curt Bears, MD 08/28/2021 9:57 AM

## 2021-08-28 NOTE — Anesthesia Procedure Notes (Signed)
Procedure Name: Intubation Date/Time: 08/28/2021 10:50 AM Performed by: Vonna Drafts, CRNA Pre-anesthesia Checklist: Patient identified, Emergency Drugs available, Suction available and Patient being monitored Patient Re-evaluated:Patient Re-evaluated prior to induction Oxygen Delivery Method: Circle system utilized Preoxygenation: Pre-oxygenation with 100% oxygen Induction Type: IV induction Ventilation: Mask ventilation without difficulty Laryngoscope Size: Mac and 4 Grade View: Grade II Tube type: Oral Number of attempts: 1 Airway Equipment and Method: Stylet and Oral airway Placement Confirmation: ETT inserted through vocal cords under direct vision, positive ETCO2 and breath sounds checked- equal and bilateral Secured at: 23 cm Tube secured with: Tape Dental Injury: Teeth and Oropharynx as per pre-operative assessment

## 2021-08-28 NOTE — Discharge Instructions (Addendum)
Post procedure care instructions No driving for 4 days. No lifting over 5 lbs for 1 week. No vigorous or sexual activity for 1 week. You may return to work/your usual activities on 09/05/21. Keep procedure site clean & dry. If you notice increased pain, swelling, bleeding or pus, call/return!  You may shower after 24 hours, but no soaking in baths/hot tubs/pools for 1 week.    You have an appointment set up with the [facility].  Multiple studies have shown that being followed by a dedicated atrial fibrillation clinic in addition to the standard care you receive from your other physicians improves health. We believe that enrollment in the atrial fibrillation clinic will allow [place] to better care for you.   The phone number to the [facility] is [phone]. The clinic is staffed Monday through Friday from 8:30am to 5pm.  Parking Directions: The clinic is located in the Heart and Vascular Building connected to [facility]. 1)From [address] turn on to [address] and go to the 3rd entrance  (Heart and Vascular entrance) on the right. 2)Look to the right for Heart &Vascular Parking Garage. 3)A code for the entrance is required, for Feb is 1204   4)Take the elevators to the 1st floor. Registration is in the room with the glass walls at the end of the hallway.  If you have any trouble parking or locating the clinic, please dont hesitate to call [phone].   Cardiac Ablation, Care After  This sheet gives you information about how to care for yourself after your procedure. Your health care provider may also give you more specific instructions. If you have problems or questions, contact your health care provider. What can I expect after the procedure? After the procedure, it is common to have: Bruising around your puncture site. Tenderness around your puncture site. Skipped heartbeats. Tiredness (fatigue).  Follow these instructions at  home: Puncture site care  Follow instructions from your health care provider about how to take care of your puncture site. Make sure you: If present, leave stitches (sutures), skin glue, or adhesive strips in place. These skin closures may need to stay in place for up to 2 weeks. If adhesive strip edges start to loosen and curl up, you may trim the loose edges. Do not remove adhesive strips completely unless your health care provider tells you to do that. If a square bandage is present, this may be removed in 24 hours.  Check your puncture site every day for signs of infection. Check for: Redness, swelling, or pain. Fluid or blood. If your puncture site starts to bleed, lie down on your back, apply firm pressure to the area, and contact your health care provider. Warmth. Pus or a bad smell. Driving Do not drive for at least 4 days after your procedure or however long your health care provider recommends. (Do not resume driving if you have previously been instructed not to drive for other health reasons.) Do not drive or use heavy machinery while taking prescription pain medicine. Activity Avoid activities that take a lot of effort for at least 7 days after your procedure. Do not lift anything that is heavier than 5 lb (4.5 kg) for one week.  No sexual activity for 1 week.  Return to your normal activities as told by your health care provider. Ask your health care provider what activities are safe for you. General instructions Take over-the-counter and prescription medicines only as told by your health care provider. Do  not use any products that contain nicotine or tobacco, such as cigarettes and e-cigarettes. If you need help quitting, ask your health care provider. You may shower after 24 hours, but Do not take baths, swim, or use a hot tub for 1 week.  Do not drink alcohol for 24 hours after your procedure. Keep all follow-up visits as told by your health care provider. This is  important. Contact a health care provider if: You have redness, mild swelling, or pain around your puncture site. You have fluid or blood coming from your puncture site that stops after applying firm pressure to the area. Your puncture site feels warm to the touch. You have pus or a bad smell coming from your puncture site. You have a fever. You have chest pain or discomfort that spreads to your neck, jaw, or arm. You are sweating a lot. You feel nauseous. You have a fast or irregular heartbeat. You have shortness of breath. You are dizzy or light-headed and feel the need to lie down. You have pain or numbness in the arm or leg closest to your puncture site. Get help right away if: Your puncture site suddenly swells. Your puncture site is bleeding and the bleeding does not stop after applying firm pressure to the area. These symptoms may represent a serious problem that is an emergency. Do not wait to see if the symptoms will go away. Get medical help right away. Call your local emergency services (911 in the [place] Do not drive yourself to the hospital. Summary After the procedure, it is normal to have bruising and tenderness at the puncture site in your groin, neck, or forearm. Check your puncture site every day for signs of infection. Get help right away if your puncture site is bleeding and the bleeding does not stop after applying firm pressure to the area. This is a medical emergency. This information is not intended to replace advice given to you by your health care provider. Make sure you discuss any questions you have with your health care provider.

## 2021-08-28 NOTE — Progress Notes (Signed)
At 1630, RN noticed slight ooze at the left groin site. Manual pressure held x10 minutes and site redressed. Pt supine for additional 30 minutes.   At 1700, RN noticed another ooze at the left groin site. Manual pressure held x10 additional minutes and site redressed. Pt supine for additional 30 minutes. Dr. Curt Bears notified asked RN to notify him if site oozed anymore.   At 1730, there was no oozing noted to the left groin site. Pt ambulated without difficulty or bleeding. Discharged home with his girlfriend who will drive and stay with pt x 24 hrs.

## 2021-08-28 NOTE — Anesthesia Preprocedure Evaluation (Addendum)
Anesthesia Evaluation  Patient identified by MRN, date of birth, ID band Patient awake    Reviewed: Allergy & Precautions, NPO status , Patient's Chart, lab work & pertinent test results  Airway Mallampati: II  TM Distance: >3 FB Neck ROM: Full    Dental no notable dental hx.    Pulmonary sleep apnea and Continuous Positive Airway Pressure Ventilation ,    Pulmonary exam normal        Cardiovascular hypertension, Pt. on medications + dysrhythmias (on Eliquis) Atrial Fibrillation  Rhythm:Irregular Rate:Normal     Neuro/Psych negative neurological ROS  negative psych ROS   GI/Hepatic negative GI ROS, Neg liver ROS,   Endo/Other  diabetes, Type 1, Insulin Dependent  Renal/GU negative Renal ROS  negative genitourinary   Musculoskeletal negative musculoskeletal ROS (+)   Abdominal Normal abdominal exam  (+)   Peds  Hematology negative hematology ROS (+)   Anesthesia Other Findings   Reproductive/Obstetrics                            Anesthesia Physical Anesthesia Plan  ASA: 3  Anesthesia Plan: General   Post-op Pain Management:    Induction: Intravenous  PONV Risk Score and Plan: 2 and Ondansetron, Dexamethasone, Midazolam and Treatment may vary due to age or medical condition  Airway Management Planned: Mask and Oral ETT  Additional Equipment: None  Intra-op Plan:   Post-operative Plan: Extubation in OR  Informed Consent: I have reviewed the patients History and Physical, chart, labs and discussed the procedure including the risks, benefits and alternatives for the proposed anesthesia with the patient or authorized representative who has indicated his/her understanding and acceptance.     Dental advisory given  Plan Discussed with: CRNA  Anesthesia Plan Comments: (Lab Results      Component                Value               Date                      WBC                       4.7                 08/15/2021                HGB                      13.1                08/15/2021                HCT                      40.2                08/15/2021                MCV                      94                  08/15/2021                PLT  216                 08/15/2021           Lab Results      Component                Value               Date                      NA                       136                 08/15/2021                K                        4.3                 08/15/2021                CO2                      27                  08/15/2021                GLUCOSE                  348 (H)             08/15/2021                BUN                      15                  08/15/2021                CREATININE               0.99                08/15/2021                CALCIUM                  8.8                 08/15/2021                EGFR                     87                  08/15/2021                GFRNONAA                 >60                 04/10/2021          )        Anesthesia Quick Evaluation

## 2021-08-28 NOTE — Transfer of Care (Signed)
Immediate Anesthesia Transfer of Care Note  Patient: Steven Pegg Sr.  Procedure(s) Performed: ATRIAL FIBRILLATION ABLATION  Patient Location: PACU and Cath Lab  Anesthesia Type:General  Level of Consciousness: drowsy  Airway & Oxygen Therapy: Patient Spontanous Breathing  Post-op Assessment: Report given to RN and Post -op Vital signs reviewed and stable  Post vital signs: Reviewed and stable  Last Vitals:  Vitals Value Taken Time  BP    Temp    Pulse 83 08/28/21 1259  Resp 16 08/28/21 1259  SpO2 99 % 08/28/21 1259  Vitals shown include unvalidated device data.  Last Pain:  Vitals:   08/28/21 0756  TempSrc:   PainSc: 0-No pain         Complications: There were no known notable events for this encounter.

## 2021-08-28 NOTE — Anesthesia Postprocedure Evaluation (Signed)
Anesthesia Post Note  Patient: Steven Tremaine Sr.  Procedure(s) Performed: ATRIAL FIBRILLATION ABLATION     Patient location during evaluation: PACU Anesthesia Type: General Level of consciousness: awake and alert Pain management: pain level controlled Vital Signs Assessment: post-procedure vital signs reviewed and stable Respiratory status: spontaneous breathing, nonlabored ventilation, respiratory function stable and patient connected to nasal cannula oxygen Cardiovascular status: blood pressure returned to baseline and stable Postop Assessment: no apparent nausea or vomiting Anesthetic complications: no   There were no known notable events for this encounter.  Last Vitals:  Vitals:   08/28/21 1344 08/28/21 1515  BP: 124/74 120/69  Pulse: 81 77  Resp: 13 17  Temp:    SpO2: 99% 95%    Last Pain:  Vitals:   08/28/21 1500  TempSrc:   PainSc: 3                  Arles Rumbold P Eura Mccauslin

## 2021-08-29 ENCOUNTER — Encounter (HOSPITAL_COMMUNITY): Payer: Self-pay | Admitting: Cardiology

## 2021-08-29 LAB — GLUCOSE, CAPILLARY: Glucose-Capillary: 256 mg/dL — ABNORMAL HIGH (ref 70–99)

## 2021-09-03 ENCOUNTER — Encounter: Payer: Self-pay | Admitting: Cardiology

## 2021-09-04 NOTE — Telephone Encounter (Signed)
Followed up with patient who reports he is having continued issue/discomfort from right groin site.  Denies any drainage/oozing from site. The is some swelling, and discomfort upon ambulation. Pt scheduled to see EP PA tomorrow for further evaluation. Pt is agreeable to plan.

## 2021-09-04 NOTE — Progress Notes (Signed)
Cardiology Office Note Date:  09/05/2021  Patient ID:  Steven Krolczyk Sr., DOB Nov 26, 1960, MRN [id number] PCP:  Shirline Frees, MD  Electrophysiologist: Dr. Curt Bears     Chief Complaint: R groin discomfort post ablation  History of Present Illness: Steven Hasten Sr. is a 61 y.o. male with history of DM type one (has insulin pump), HTN, HLD, AFib, OSA w/CPAP  He comes in today to be seen for Dr. Curt Bears, referred to him for Afib Nov 2022, planned for ablation for management strategy. He underwent ablation on 08/28/21.  Pt called in reporting R groin discomfort with ambulation post procedure, none on the left, given an ppt to come in.  TODAY Post ablation he noted some R groin discomfort though was also having some CP, and more concentrated on that, the CP was slight/worse with inspiration and resolved after a couple days.  Has had some fleeting infrequent palpitations only. His L groin is without concern/discomfort R side though when he gets up from seated is aware of his groin, when walking also has a bit of discomfort sometimes painful, always localized to the groin, no radiation, but will be enough that he has to slow, or sit. No rest pain No abdominal or back pain. No bleeding Thinks it may be a little swollen. Yesterday he walked the dog as usual and was aware of it, but did not take long strides or over do anything. Today he took tylenol for the 1st time and noticed when he was here getting out of the car, felt ok and without discomfort  He has been complaint with Eliquis/all of his medicines   Afib Hx Diagnosed +/- 2020 Flecainide 08/28/2021: PVI (and LA) ablation   Past Medical History:  Diagnosis Date   Abnormal EKG    Chest pain    COVID    Diabetes mellitus without complication ([place])    HTN (hypertension)    Hyperlipidemia    LVH (left ventricular hypertrophy) due to hypertensive disease    OSA on CPAP    Palpitations    Paroxysmal atrial  fibrillation ([place])    Shift work sleep disorder    Somnolence     Past Surgical History:  Procedure Laterality Date   ATRIAL FIBRILLATION ABLATION N/A 08/28/2021   Procedure: ATRIAL FIBRILLATION ABLATION;  Surgeon: Constance Haw, MD;  Location: [facility];  Service: Cardiovascular;  Laterality: N/A;   BACK SURGERY     CARDIAC CATHETERIZATION  2007    Current Outpatient Medications  Medication Sig Dispense Refill   apixaban (ELIQUIS) 5 MG TABS tablet Take 5 mg by mouth 2 (two) times daily.     atorvastatin (LIPITOR) 20 MG tablet Take 20 mg by mouth at bedtime.     diltiazem (CARDIZEM) 60 MG tablet Take 1 tablet (60 mg total) by mouth daily as needed. take 1 if you feel heart palpitation, and if palpitation doesn't resolve 2 hours after you take it, then go to the ED. 2 tablet 0   diltiazem (DILACOR XR) 120 MG 24 hr capsule Take 120 mg by mouth daily.     flecainide (TAMBOCOR) 50 MG tablet Take 100 mg by mouth 2 (two) times daily.     hydrochlorothiazide (HYDRODIURIL) 12.5 MG tablet Take 12.5 mg by mouth daily.     Insulin Human (INSULIN PUMP) 100 unit/ml SOLN Inject 1 each into the skin as directed. Replace insulin vial in pump every 3 days (patient uses Humalog)     No current facility-administered medications  for this visit.    Allergies:   Patient has no known allergies.   Social History:  The patient  reports that he has never smoked. He has never used smokeless tobacco. He reports that he does not drink alcohol and does not use drugs.   Family History:  The patient's family history includes Diabetes in his mother; Heart disease in his mother; Stroke in his mother.  ROS:  Please see the history of present illness.    All other systems are reviewed and otherwise negative.   PHYSICAL EXAM:  VS:  BP 128/72    Pulse 68    Ht 6\' 4"  (1.93 m)    Wt 187 lb 12.8 oz (85.2 kg)    SpO2 98%    BMI 22.86 kg/m  BMI: Body mass index is 22.86 kg/m. Well nourished, well  developed, in no acute distress HEENT: normocephalic, atraumatic Neck: no JVD, carotid bruits or masses Cardiac:  RRR; no significant murmurs, no rubs, or gallops Lungs:  CTA b/l, no wheezing, rhonchi or rales Abd: soft, nontender MS: no deformity or atrophy Ext: no edema Skin: warm and dry, no rash Neuro:  No gross deficits appreciated Psych: euthymic mood, full affect  L groin is soft, nontender, excllent femoral and pedal pulses R groin, also soft, small  1cm round knot, no hematoma, minimally tender with deep palpation, excellent femoral and pedal pulses No bruit    EKG:  Done today and reviewed by myself shows  SR 68bpm, stable intervals, 1st degree Avblock 239ms   08/28/2021: EPS/ablation CONCLUSIONS: 1. Sinus rhythm upon presentation.   2. Successful electrical isolation and anatomical encircling of all four pulmonary veins with radiofrequency current.  A WACA approach was used 3. Additional left atrial ablation was performed with a standard box lesion created along the posterior wall of the left atrium 4. No early apparent complications.    Recent Labs: 02/18/2021: Magnesium 2.0 04/10/2021: ALT 23 04/11/2021: TSH 1.717 08/15/2021: BUN 15; Creatinine, Ser 0.99; Hemoglobin 13.1; Platelets 216; Potassium 4.3; Sodium 136  No results found for requested labs within last 8760 hours.   CrCl cannot be calculated (Patient's most recent lab result is older than the maximum 21 days allowed.).   Wt Readings from Last 3 Encounters:  09/05/21 187 lb 12.8 oz (85.2 kg)  08/28/21 180 lb (81.6 kg)  06/19/21 179 lb (81.2 kg)     Other studies reviewed: Additional studies/records reviewed today include: summarized above  ASSESSMENT AND PLAN:  Persistent AFib CHA2DS2Vasc is 2, on Eliquis, appropriately dosed flecainide/dilt R groin pain post ablation, low suspicion of pseudoaneurysm Tylenol today resolved his discomfort Will get R LE arterial US to evaluate further, I have asked  him not to return to full duty until [place] is completed/resulted   HTN Looks ok   Disposition: F/u with Afib clinic and Dr. Curt Bears as scheduled, will f/u with him once [place] result is back  Current medicines are reviewed at length with the patient today.  The patient did not have any concerns regarding medicines.  Venetia Night, PA-C 09/05/2021 2:46 PM     [address] [place] [address] [phone] (office)  [phone] (fax)

## 2021-09-05 ENCOUNTER — Ambulatory Visit: Payer: 59 | Admitting: Physician Assistant

## 2021-09-05 ENCOUNTER — Other Ambulatory Visit: Payer: Self-pay

## 2021-09-05 ENCOUNTER — Encounter: Payer: Self-pay | Admitting: Physician Assistant

## 2021-09-05 VITALS — BP 128/72 | HR 68 | Ht 76.0 in | Wt 187.8 lb

## 2021-09-05 DIAGNOSIS — I729 Aneurysm of unspecified site: Secondary | ICD-10-CM | POA: Diagnosis not present

## 2021-09-05 DIAGNOSIS — I4819 Other persistent atrial fibrillation: Secondary | ICD-10-CM

## 2021-09-05 DIAGNOSIS — G8918 Other acute postprocedural pain: Secondary | ICD-10-CM | POA: Diagnosis not present

## 2021-09-05 DIAGNOSIS — T81718A Complication of other artery following a procedure, not elsewhere classified, initial encounter: Secondary | ICD-10-CM | POA: Diagnosis not present

## 2021-09-05 NOTE — Patient Instructions (Addendum)
Medication Instructions:   Your physician recommends that you continue on your current medications as directed. Please refer to the Current Medication list given to you today.   *If you need a refill on your cardiac medications before your next appointment, please call your pharmacy*   Lab Work: NONE ORDERED  TODAY   If you have labs (blood work) drawn today and your tests are completely normal, you will receive your results only by: MyChart Message (if you have MyChart) OR A paper copy in the mail If you have any lab test that is abnormal or we need to change your treatment, we will call you to review the results.   Testing/Procedures: Your physician has requested that you have a lower extremity arterial exercise duplex. During this test, exercise and ultrasound are used to evaluate arterial blood flow in the legs. Allow one hour for this exam. There are no restrictions or special instructions.   Follow-Up: At [facility], you and your health needs are our priority.  As part of our continuing mission to provide you with exceptional heart care, we have created designated Provider Care Teams.  These Care Teams include your primary Cardiologist (physician) and Advanced Practice Providers (APPs -  Physician Assistants and Nurse Practitioners) who all work together to provide you with the care you need, when you need it.  We recommend signing up for the patient portal called "MyChart".  Sign up information is provided on this After Visit Summary.  MyChart is used to connect with patients for Virtual Visits (Telemedicine).  Patients are able to view lab/test results, encounter notes, upcoming appointments, etc.  Non-urgent messages can be sent to your provider as well.   To learn more about what you can do with MyChart, go to [email or web address].    Your next appointment:  AS SCHEDULED }

## 2021-09-06 ENCOUNTER — Telehealth: Payer: Self-pay | Admitting: Cardiology

## 2021-09-06 NOTE — Telephone Encounter (Signed)
Patient would like to follow up with nurse about his visit yesterday.  He would like to talk to nurse about what he needs to do about work.

## 2021-09-07 ENCOUNTER — Encounter: Payer: Self-pay | Admitting: Cardiology

## 2021-09-08 ENCOUNTER — Telehealth: Payer: Self-pay | Admitting: Cardiology

## 2021-09-08 NOTE — Telephone Encounter (Signed)
Patient was calling in for update in regards to his mychart message on yesterday. He states he is feeling a lot better and would like to return to work on Monday with no restriction. Please advise

## 2021-09-08 NOTE — Telephone Encounter (Signed)
Pt reports much improvement in groin, and able to ambulate without issues. Discussed w/ Francis Dowse, PA that recently saw pt on matter. Pt advised he may return to work on Monday if feeling better, per the [place]. Pt made aware letter filled out and left at front desk for him to pick up this afternoon. Pt appreciates our help with this.

## 2021-09-13 NOTE — Telephone Encounter (Signed)
Late Entry:  Called pt the day this message was sent. Paperwork filled out for pt to return to work before [place] per pt feeling improved.

## 2021-09-25 ENCOUNTER — Other Ambulatory Visit: Payer: Self-pay

## 2021-09-25 ENCOUNTER — Encounter (HOSPITAL_COMMUNITY): Payer: Self-pay | Admitting: Physician Assistant

## 2021-09-25 ENCOUNTER — Ambulatory Visit (HOSPITAL_COMMUNITY)
Admit: 2021-09-25 | Discharge: 2021-09-25 | Disposition: A | Discharge status: Home / Self Care | Payer: 59 | Attending: Physician Assistant | Admitting: Physician Assistant

## 2021-09-25 VITALS — BP 116/70 | HR 70 | Ht 76.0 in | Wt 184.2 lb

## 2021-09-25 DIAGNOSIS — E109 Type 1 diabetes mellitus without complications: Secondary | ICD-10-CM | POA: Diagnosis not present

## 2021-09-25 DIAGNOSIS — I10 Essential (primary) hypertension: Secondary | ICD-10-CM | POA: Diagnosis not present

## 2021-09-25 DIAGNOSIS — Z79899 Other long term (current) drug therapy: Secondary | ICD-10-CM | POA: Diagnosis not present

## 2021-09-25 DIAGNOSIS — G4733 Obstructive sleep apnea (adult) (pediatric): Secondary | ICD-10-CM | POA: Diagnosis not present

## 2021-09-25 DIAGNOSIS — E785 Hyperlipidemia, unspecified: Secondary | ICD-10-CM | POA: Diagnosis not present

## 2021-09-25 DIAGNOSIS — I4819 Other persistent atrial fibrillation: Secondary | ICD-10-CM | POA: Insufficient documentation

## 2021-09-25 DIAGNOSIS — Z7901 Long term (current) use of anticoagulants: Secondary | ICD-10-CM | POA: Diagnosis not present

## 2021-09-25 DIAGNOSIS — J984 Other disorders of lung: Secondary | ICD-10-CM | POA: Insufficient documentation

## 2021-09-25 DIAGNOSIS — Z9989 Dependence on other enabling machines and devices: Secondary | ICD-10-CM | POA: Diagnosis not present

## 2021-09-25 DIAGNOSIS — D6869 Other thrombophilia: Secondary | ICD-10-CM | POA: Insufficient documentation

## 2021-09-25 NOTE — Progress Notes (Signed)
Primary Care Physician: Shirline Frees, MD Primary Electrophysiologist: Dr Curt Bears Referring Physician: Dr Alric Ran Sr. is a 61 y.o. male with a history of type I DM, HTN, HLD, OSA, atrial fibrillation who presents for follow up in the [facility]. Patient is on Eliquis for a CHADS2VASC score of 2. He underwent afib ablation with Dr Curt Bears on 08/28/21. He was seen in follow up on 09/05/21 with groin pain, groin [place] ordered. He states that since then he has not had any groin pain or swelling. He is feeling well with no episodes of afib. He denies CP or swallowing pain.   Today, he denies symptoms of palpitations, chest pain, shortness of breath, orthopnea, PND, lower extremity edema, dizziness, presyncope, syncope, bleeding, or neurologic sequela. The patient is tolerating medications without difficulties and is otherwise without complaint today.    Atrial Fibrillation Risk Factors:  he does have symptoms or diagnosis of sleep apnea. he is compliant with CPAP therapy. he does not have a history of rheumatic fever.   he has a BMI of Body mass index is 22.42 kg/m.Marland Kitchen Filed Weights   09/25/21 1115  Weight: 83.6 kg    Family History  Problem Relation Age of Onset   Diabetes Mother    Stroke Mother    Heart disease Mother      Atrial Fibrillation Management history:  Previous antiarrhythmic drugs: flecainide  Previous cardioversions: none Previous ablations: 08/28/21 CHADS2VASC score: 2 Anticoagulation history: Eliquis   Past Medical History:  Diagnosis Date   Abnormal EKG    Chest pain    COVID    Diabetes mellitus without complication ([place])    HTN (hypertension)    Hyperlipidemia    LVH (left ventricular hypertrophy) due to hypertensive disease    OSA on CPAP    Palpitations    Paroxysmal atrial fibrillation ([place])    Shift work sleep disorder    Somnolence    Past Surgical History:  Procedure Laterality Date    ATRIAL FIBRILLATION ABLATION N/A 08/28/2021   Procedure: ATRIAL FIBRILLATION ABLATION;  Surgeon: Constance Haw, MD;  Location: [facility];  Service: Cardiovascular;  Laterality: N/A;   BACK SURGERY     CARDIAC CATHETERIZATION  2007    Current Outpatient Medications  Medication Sig Dispense Refill   apixaban (ELIQUIS) 5 MG TABS tablet Take 5 mg by mouth 2 (two) times daily.     atorvastatin (LIPITOR) 20 MG tablet Take 20 mg by mouth at bedtime.     Continuous Blood Gluc Sensor (FREESTYLE LIBRE 14 DAY SENSOR) MISC Apply topically.     diltiazem (CARDIZEM) 60 MG tablet Take 1 tablet (60 mg total) by mouth daily as needed. take 1 if you feel heart palpitation, and if palpitation doesn't resolve 2 hours after you take it, then go to the ED. 2 tablet 0   diltiazem (DILACOR XR) 120 MG 24 hr capsule Take 120 mg by mouth daily.     flecainide (TAMBOCOR) 50 MG tablet Take 100 mg by mouth 2 (two) times daily.     HUMALOG 100 UNIT/ML injection Inject into the skin.     hydrochlorothiazide (HYDRODIURIL) 12.5 MG tablet Take 12.5 mg by mouth daily.     Insulin Human (INSULIN PUMP) 100 unit/ml SOLN Inject 1 each into the skin as directed. Replace insulin vial in pump every 3 days (patient uses Humalog)     No current facility-administered medications for this encounter.  No Known Allergies  Social History   Socioeconomic History   Marital status: Divorced    Spouse name: Not on file   Number of children: Not on file   Years of education: Not on file   Highest education level: Not on file  Occupational History   Not on file  Tobacco Use   Smoking status: Never   Smokeless tobacco: Never  Substance and Sexual Activity   Alcohol use: Yes    Alcohol/week: 1.0 standard drink    Types: 1 Standard drinks or equivalent per week    Comment: once a month rum and diet coke mixed 09/25/21   Drug use: No   Sexual activity: Not on file  Other Topics Concern   Not on file  Social  History Narrative   Not on file   Social Determinants of Health   Financial Resource Strain: Not on file  Food Insecurity: Not on file  Transportation Needs: Not on file  Physical Activity: Not on file  Stress: Not on file  Social Connections: Not on file  Intimate Partner Violence: Not on file     ROS- All systems are reviewed and negative except as per the HPI above.  Physical Exam: Vitals:   09/25/21 1115  BP: 116/70  Pulse: 70  Weight: 83.6 kg  Height: 6\' 4"  (1.93 m)    GEN- The patient is a well appearing male, alert and oriented x 3 today.   Head- normocephalic, atraumatic Eyes-  Sclera clear, conjunctiva pink Ears- hearing intact Oropharynx- clear Neck- supple  Lungs- Clear to ausculation bilaterally, normal work of breathing Heart- Regular rate and rhythm, no murmurs, rubs or gallops  GI- soft, NT, ND, + BS Extremities- no clubbing, cyanosis, or edema MS- no significant deformity or atrophy Skin- no rash or lesion Psych- euthymic mood, full affect Neuro- strength and sensation are intact  Wt Readings from Last 3 Encounters:  09/25/21 83.6 kg  09/05/21 85.2 kg  08/28/21 81.6 kg    EKG today demonstrates  SR Vent. rate 70 BPM PR interval 202 ms QRS duration 110 ms QT/QTcB 372/401 ms  Epic records are reviewed at length today  CHA2DS2-VASc Score = 2  The patient's score is based upon: CHF History: 0 HTN History: 1 Diabetes History: 1 Stroke History: 0 Vascular Disease History: 0 Age Score: 0 Gender Score: 0       ASSESSMENT AND PLAN: 1. Persistent Atrial Fibrillation (ICD10:  I48.19) The patient's CHA2DS2-VASc score is 2, indicating a 2.2% annual risk of stroke.   S/p afib ablation 08/28/21 Patient appears to be maintaining SR. He is not having any groin pain or swelling at this time, will cancel groin u/s. Continue Eliquis 5 mg BID with no missed doses for 3 months post ablation.  Continue flecainide 100 mg BID Continue diltiazem 120 mg  daily with 60 mg PRN for heart racing.  2. Secondary Hypercoagulable State (ICD10:  D68.69) The patient is at significant risk for stroke/thromboembolism based upon his CHA2DS2-VASc Score of 2.  Continue Apixaban (Eliquis).   3. HTN Stable, no changes today.   Follow up with Dr Curt Bears as scheduled.    [facility] [place],  [address] [phone] 09/25/2021 1:20 PM

## 2021-09-27 ENCOUNTER — Ambulatory Visit (HOSPITAL_COMMUNITY): Payer: 59

## 2021-12-14 ENCOUNTER — Encounter: Payer: Self-pay | Admitting: Cardiology

## 2021-12-14 ENCOUNTER — Ambulatory Visit (INDEPENDENT_AMBULATORY_CARE_PROVIDER_SITE_OTHER): Payer: 59 | Admitting: Cardiology

## 2021-12-14 VITALS — BP 108/76 | HR 68 | Ht 76.0 in | Wt 181.0 lb

## 2021-12-14 DIAGNOSIS — D6869 Other thrombophilia: Secondary | ICD-10-CM | POA: Diagnosis not present

## 2021-12-14 DIAGNOSIS — I480 Paroxysmal atrial fibrillation: Secondary | ICD-10-CM | POA: Diagnosis not present

## 2021-12-14 NOTE — Progress Notes (Signed)
? ?Electrophysiology Office Note ? ? ?Date:  12/14/2021  ? ?ID:  Steven Joye Sr., DOB August 04, 1961, MRN [id number] ? ?PCP:  Shirline Frees, MD  ?Cardiologist:  Lennice Sites ?Primary Electrophysiologist:  Macky Galik [name], MD   ? ?Chief Complaint: AF ?  ?History of Present Illness: ?Steven Kudo Sr. is a 61 y.o. male who is being seen today for the evaluation of AF at the request of Shirline Frees, MD. Presenting today for electrophysiology evaluation. ? ?He has a history significant for atrial fibrillation, type 1 diabetes, hypertension, hyperlipidemia.  He was diagnosed with atrial fibrillation in 2021.  He feels palpitations when he is in atrial fibrillation.  He was initially on flecainide but continued to have episodes of atrial fibrillation.  He is status post ablation 08/28/2021. ? ?Today, denies symptoms of palpitations, chest pain, shortness of breath, orthopnea, PND, lower extremity edema, claudication, dizziness, presyncope, syncope, bleeding, or neurologic sequela. The patient is tolerating medications without difficulties.  Since being seen he has done well.  He has not had any further episodes of atrial fibrillation since his ablation.  He is able to exercise and exert himself without issue.  He states that when his heart rate goes up, he knows that it is because he is exerting himself and not due to atrial fibrillation. ? ? ?Past Medical History:  ?Diagnosis Date  ? Abnormal EKG   ? Chest pain   ? COVID   ? Diabetes mellitus without complication ([place])   ? HTN (hypertension)   ? Hyperlipidemia   ? LVH (left ventricular hypertrophy) due to hypertensive disease   ? OSA on CPAP   ? Palpitations   ? Paroxysmal atrial fibrillation (HCC)   ? Shift work sleep disorder   ? Somnolence   ? ?Past Surgical History:  ?Procedure Laterality Date  ? ATRIAL FIBRILLATION ABLATION N/A 08/28/2021  ? Procedure: ATRIAL FIBRILLATION ABLATION;  Surgeon: Constance Haw, MD;  Location: [facility];  Service:  Cardiovascular;  Laterality: N/A;  ? BACK SURGERY    ? CARDIAC CATHETERIZATION  2007  ? ? ? ?Current Outpatient Medications  ?Medication Sig Dispense Refill  ? apixaban (ELIQUIS) 5 MG TABS tablet Take 5 mg by mouth 2 (two) times daily.    ? atorvastatin (LIPITOR) 20 MG tablet Take 20 mg by mouth at bedtime.    ? Continuous Blood Gluc Sensor (FREESTYLE LIBRE 14 DAY SENSOR) MISC Apply topically.    ? diltiazem (CARDIZEM) 60 MG tablet Take 1 tablet (60 mg total) by mouth daily as needed. take 1 if you feel heart palpitation, and if palpitation doesn't resolve 2 hours after you take it, then go to the ED. 2 tablet 0  ? HUMALOG 100 UNIT/ML injection Inject into the skin as directed.    ? hydrochlorothiazide (HYDRODIURIL) 12.5 MG tablet Take 12.5 mg by mouth daily.    ? Insulin Human (INSULIN PUMP) 100 unit/ml SOLN Inject 1 each into the skin as directed. Replace insulin vial in pump every 3 days (patient uses Humalog)    ? ?No current facility-administered medications for this visit.  ? ? ?Allergies:   Patient has no known allergies.  ? ?Social History:  The patient  reports that he has never smoked. He has never used smokeless tobacco. He reports current alcohol use of about 1.0 standard drink per week. He reports that he does not use drugs.  ? ?Family History:  The patient's family history includes Diabetes in his mother; Heart disease in his mother; Stroke  in his mother.  ? ?ROS:  Please see the history of present illness.   Otherwise, review of systems is positive for none.   All other systems are reviewed and negative.  ? ?PHYSICAL EXAM: ?VS:  BP 108/76   Pulse 68   Ht 6\' 4"  (1.93 m)   Wt 181 lb (82.1 kg)   SpO2 96%   BMI 22.03 kg/m?  , BMI Body mass index is 22.03 kg/m?. ?GEN: Well nourished, well developed, in no acute distress  ?HEENT: normal  ?Neck: no JVD, carotid bruits, or masses ?Cardiac: RRR; no murmurs, rubs, or gallops,no edema  ?Respiratory:  clear to auscultation bilaterally, normal work of  breathing ?GI: soft, nontender, nondistended, + BS ?MS: no deformity or atrophy  ?Skin: warm and dry ?Neuro:  Strength and sensation are intact ?Psych: euthymic mood, full affect ? ?EKG:  EKG is ordered today. ?Personal review of the ekg ordered shows sinus rhythm, rate 68 ? ? ?Recent Labs: ?02/18/2021: Magnesium 2.0 ?04/10/2021: ALT 23 ?04/11/2021: TSH 1.717 ?08/15/2021: BUN 15; Creatinine, Ser 0.99; Hemoglobin 13.1; Platelets 216; Potassium 4.3; Sodium 136  ? ? ?Lipid Panel  ?No results found for: CHOL, TRIG, HDL, CHOLHDL, VLDL, LDLCALC, LDLDIRECT ? ? ?Wt Readings from Last 3 Encounters:  ?12/14/21 181 lb (82.1 kg)  ?09/25/21 184 lb 3.2 oz (83.6 kg)  ?09/05/21 187 lb 12.8 oz (85.2 kg)  ?  ? ? ?Other studies Reviewed: ?Additional studies/ records that were reviewed today include: TTE 03/06/21  ?Review of the above records today demonstrates:  ?Ejection fraction 55 to 60% ?Left atrium normal in size and function ?Right ventricle normal in size and function ?Structurally normal aortic and mitral valves without stenosis or regurgitation ? ?ASSESSMENT AND PLAN: ? ?1.  Persistent atrial fibrillation: Currently on Eliquis 5 mg twice daily, diltiazem 120 mg daily, flecainide 100 mg twice daily.  Patient monitoring for flecainide via ECG today.  CHA2DS2-VASc of 3.  Status post ablation 08/28/2021.  He remained in sinus rhythm without further episodes of atrial fibrillation.  Due to that, we Carlina Derks stop flecainide and diltiazem today. ? ?2.  Hypertension: Currently well controlled ? ?3.  Hyperlipidemia: Continue atorvastatin 20 mg per primary cardiology ? ?4.  Diabetes: Type I on insulin pump ? ?5.  Secondary hypercoagulable state: Currently on Eliquis for atrial fibrillation as above ? ?Current medicines are reviewed at length with the patient today.   ?The patient does not have concerns regarding his medicines.  The following changes were made today: Stop diltiazem, flecainide ? ?Labs/ tests ordered today include:  ?Orders Placed  This Encounter  ?Procedures  ? EKG 12-Lead  ? ? ? ?Disposition:   FU 6 months ? ?Signed, ?Patrici Minnis [name], MD  ?12/14/2021 3:10 PM    ? ?[facility] ?[address] ?[address] ?[place] [address] ?[phone] (office) ?[phone] (fax); ? ?

## 2021-12-14 NOTE — Patient Instructions (Addendum)
Medication Instructions:  ?Your physician has recommended you make the following change in your medication:  ?STOP Flecainide ?STOP Diltiazem 120 mg ? ?*If you need a refill on your cardiac medications before your next appointment, please call your pharmacy* ? ? ?Lab Work: ?None ordered ? ? ?Testing/Procedures: ?None ordered ? ? ?Follow-Up: ?At [facility], you and your health needs are our priority.  As part of our continuing mission to provide you with exceptional heart care, we have created designated Provider Care Teams.  These Care Teams include your primary Cardiologist (physician) and Advanced Practice Providers (APPs -  Physician Assistants and Nurse Practitioners) who all work together to provide you with the care you need, when you need it. ? ?Your next appointment:   ?6 month(s) ? ?The format for your next appointment:   ?In Person ? ?Provider:   ?You will follow up in the [facility] located at [facility]. ?Your provider will be: ?Roderic Palau, NP or Clint R. Fenton, PA-C  ? ? ?Thank you for choosing [facility]!! ? ? ?Trinidad Curet, RN ?[phone] ? ?Important Information About Sugar ? ? ? ? ? ? ? ? ?  ?

## 2022-06-19 ENCOUNTER — Ambulatory Visit (HOSPITAL_COMMUNITY)
Admission: RE | Admit: 2022-06-19 | Discharge: 2022-06-19 | Disposition: A | Discharge status: Home / Self Care | Payer: 59 | Source: Ambulatory Visit | Attending: Physician Assistant | Admitting: Physician Assistant

## 2022-06-19 VITALS — BP 126/70 | HR 73 | Ht 76.0 in | Wt 177.2 lb

## 2022-06-19 DIAGNOSIS — Z79899 Other long term (current) drug therapy: Secondary | ICD-10-CM | POA: Diagnosis not present

## 2022-06-19 DIAGNOSIS — Z7901 Long term (current) use of anticoagulants: Secondary | ICD-10-CM | POA: Diagnosis not present

## 2022-06-19 DIAGNOSIS — I4819 Other persistent atrial fibrillation: Secondary | ICD-10-CM | POA: Diagnosis present

## 2022-06-19 DIAGNOSIS — G4733 Obstructive sleep apnea (adult) (pediatric): Secondary | ICD-10-CM | POA: Insufficient documentation

## 2022-06-19 DIAGNOSIS — I10 Essential (primary) hypertension: Secondary | ICD-10-CM | POA: Insufficient documentation

## 2022-06-19 DIAGNOSIS — D6869 Other thrombophilia: Secondary | ICD-10-CM | POA: Diagnosis not present

## 2022-06-19 DIAGNOSIS — E785 Hyperlipidemia, unspecified: Secondary | ICD-10-CM | POA: Insufficient documentation

## 2022-06-19 DIAGNOSIS — E119 Type 2 diabetes mellitus without complications: Secondary | ICD-10-CM | POA: Insufficient documentation

## 2022-06-19 NOTE — Progress Notes (Signed)
Primary Care Physician: Johny Blamer, MD Primary Electrophysiologist: Dr Elberta Fortis Referring Physician: Dr Sande Rives Sr. is a 61 y.o. male with a history of type I DM, HTN, HLD, OSA, atrial fibrillation who presents for follow up in the [facility]. Patient is on Eliquis for a CHADS2VASC score of 2. He underwent afib ablation with Dr Elberta Fortis on 08/28/21. He was seen in follow up on 09/05/21 with groin pain, groin [place] ordered. He states that since then he has not had any groin pain or swelling, u/s cancelled.  On follow up today, patient reports that he has done very well since his last visit with no episodes of afib. He denies any bleeding issues on anticoagulation. He is off AAD.   Today, he denies symptoms of palpitations, chest pain, shortness of breath, orthopnea, PND, lower extremity edema, dizziness, presyncope, syncope, bleeding, or neurologic sequela. The patient is tolerating medications without difficulties and is otherwise without complaint today.    Atrial Fibrillation Risk Factors:  he does have symptoms or diagnosis of sleep apnea. he is compliant with CPAP therapy. he does not have a history of rheumatic fever.   he has a BMI of Body mass index is 21.57 kg/m.Marland Kitchen Filed Weights   06/19/22 0833  Weight: 80.4 kg    Family History  Problem Relation Age of Onset   Diabetes Mother    Stroke Mother    Heart disease Mother      Atrial Fibrillation Management history:  Previous antiarrhythmic drugs: flecainide  Previous cardioversions: none Previous ablations: 08/28/21 CHADS2VASC score: 2 Anticoagulation history: Eliquis   Past Medical History:  Diagnosis Date   Abnormal EKG    Chest pain    COVID    Diabetes mellitus without complication (HCC)    HTN (hypertension)    Hyperlipidemia    LVH (left ventricular hypertrophy) due to hypertensive disease    OSA on CPAP    Palpitations    Paroxysmal atrial fibrillation  (HCC)    Shift work sleep disorder    Somnolence    Past Surgical History:  Procedure Laterality Date   ATRIAL FIBRILLATION ABLATION N/A 08/28/2021   Procedure: ATRIAL FIBRILLATION ABLATION;  Surgeon: Regan Lemming, MD;  Location: [facility];  Service: Cardiovascular;  Laterality: N/A;   BACK SURGERY     CARDIAC CATHETERIZATION  2007    Current Outpatient Medications  Medication Sig Dispense Refill   apixaban (ELIQUIS) 5 MG TABS tablet Take 5 mg by mouth 2 (two) times daily.     atorvastatin (LIPITOR) 20 MG tablet Take 20 mg by mouth at bedtime.     Continuous Blood Gluc Sensor (FREESTYLE LIBRE 14 DAY SENSOR) MISC Apply topically.     HUMALOG 100 UNIT/ML injection Inject into the skin as directed.     hydrochlorothiazide (HYDRODIURIL) 12.5 MG tablet Take 12.5 mg by mouth daily.     Insulin Human (INSULIN PUMP) 100 unit/ml SOLN Inject 1 each into the skin as directed. Replace insulin vial in pump every 3 days (patient uses Humalog)     ipratropium (ATROVENT) 0.03 % nasal spray Place 1 spray into both nostrils as needed.     No current facility-administered medications for this encounter.    No Known Allergies  Social History   Socioeconomic History   Marital status: Divorced    Spouse name: Not on file   Number of children: Not on file   Years of education: Not on file  Highest education level: Not on file  Occupational History   Not on file  Tobacco Use   Smoking status: Never   Smokeless tobacco: Never  Substance and Sexual Activity   Alcohol use: Yes    Alcohol/week: 1.0 standard drink of alcohol    Types: 1 Standard drinks or equivalent per week    Comment: once a month rum and diet coke mixed 09/25/21   Drug use: No   Sexual activity: Not on file  Other Topics Concern   Not on file  Social History Narrative   Not on file   Social Determinants of Health   Financial Resource Strain: Not on file  Food Insecurity: Not on file  Transportation  Needs: Not on file  Physical Activity: Not on file  Stress: Not on file  Social Connections: Not on file  Intimate Partner Violence: Not on file     ROS- All systems are reviewed and negative except as per the HPI above.  Physical Exam: Vitals:   06/19/22 0833  BP: 126/70  Pulse: 73  Weight: 80.4 kg  Height: 6\' 4"  (1.93 m)    GEN- The patient is a well appearing male, alert and oriented x 3 today.   HEENT-head normocephalic, atraumatic, sclera clear, conjunctiva pink, hearing intact, trachea midline. Lungs- Clear to ausculation bilaterally, normal work of breathing Heart- Regular rate and rhythm, no murmurs, rubs or gallops  GI- soft, NT, ND, + BS Extremities- no clubbing, cyanosis, or edema MS- no significant deformity or atrophy Skin- no rash or lesion Psych- euthymic mood, full affect Neuro- strength and sensation are intact   Wt Readings from Last 3 Encounters:  06/19/22 80.4 kg  12/14/21 82.1 kg  09/25/21 83.6 kg    EKG today demonstrates  SR Vent. rate 73 BPM PR interval 188 ms QRS duration 116 ms QT/QTcB 356/392 ms  Epic records are reviewed at length today  CHA2DS2-VASc Score = 2  The patient's score is based upon: CHF History: 0 HTN History: 1 Diabetes History: 1 Stroke History: 0 Vascular Disease History: 0 Age Score: 0 Gender Score: 0       ASSESSMENT AND PLAN: 1. Persistent Atrial Fibrillation (ICD10:  I48.19) The patient's CHA2DS2-VASc score is 2, indicating a 2.2% annual risk of stroke.   S/p afib ablation 08/28/21 Patient appears to be maintaining SR. Continue Eliquis 5 mg BID   2. Secondary Hypercoagulable State (ICD10:  D68.69) The patient is at significant risk for stroke/thromboembolism based upon his CHA2DS2-VASc Score of 2.  Continue Apixaban (Eliquis).   3. HTN Stable, no changes today.   Follow up in the AF clinic in one year.    [facility] [address] [place], [place]  [address] [phone] 06/19/2022 1:46 PM

## 2022-07-12 IMAGING — CT CT HEART MORPH/PULM VEIN W/ CM & W/O CA SCORE
2 of 6 series · 12 of 20 positions shown, 14 images · IV contrast (Omni 300)
Comparison: None.
COMPARISON: None.

Addendum:
EXAM:
OVER-READ INTERPRETATION  CT CHEST

The following report is an over-read performed by radiologist Dr.
Shizuka Kaori [[email or web address]] on 08/21/2021. This
over-read does not include interpretation of cardiac or coronary
anatomy or pathology. The coronary calcium score/coronary CTA
interpretation by the cardiologist is attached.
CLINICAL DATA: Atrial fibrillation scheduled for an ablation.
Cardiac CT/CTA
TECHNIQUE: The patient was scanned on a Siemens Somatom scanner.

[Series 7: 0-90% · axial · 0.39mm/px · z∈[+1399,+1513]mm · 6 of 3190 slices shown, 8 images]
[im 456/3190  vessel]
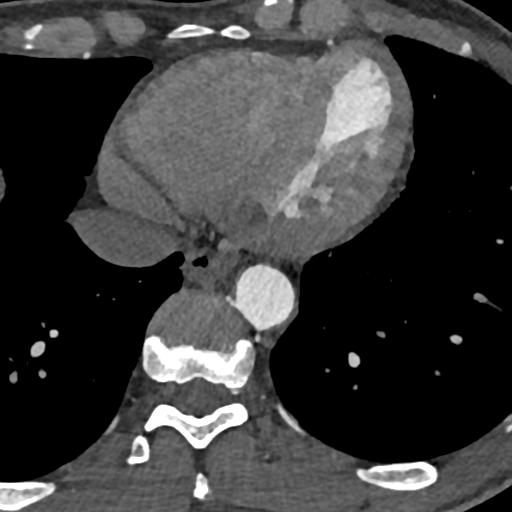
[im 456/3190  lung]
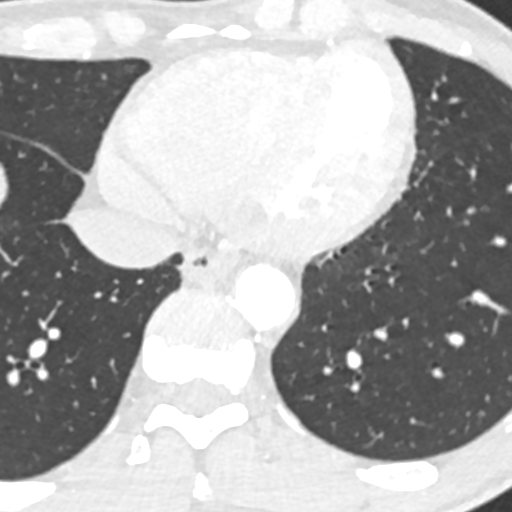
[im 912/3190  vessel]
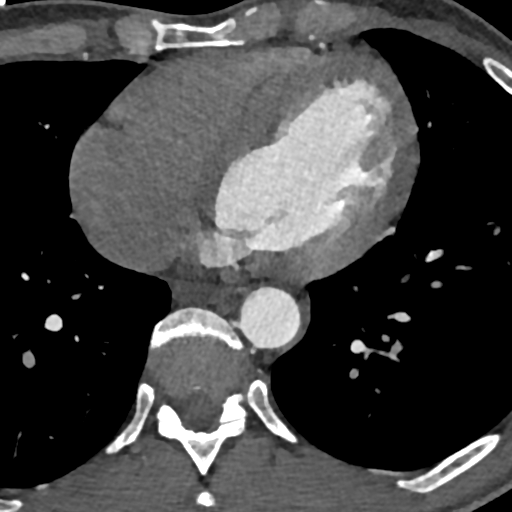
[im 1367/3190  vessel]
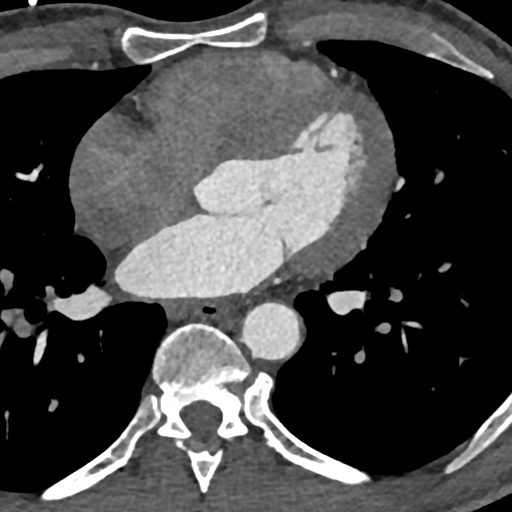
[im 1823/3190  vessel]
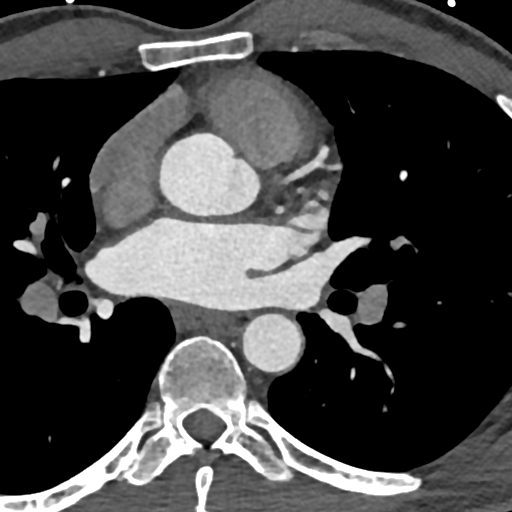
[im 2278/3190  vessel]
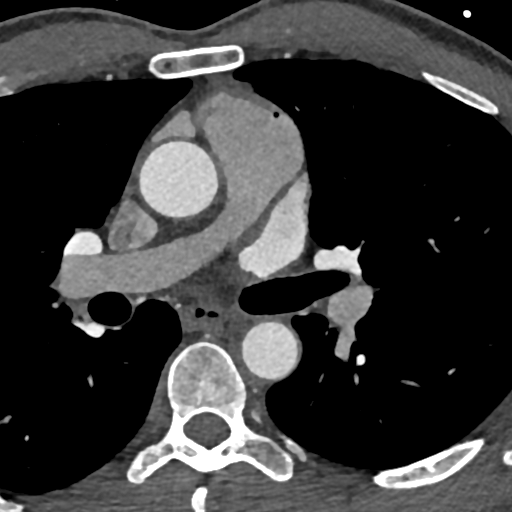
[im 2278/3190  lung]
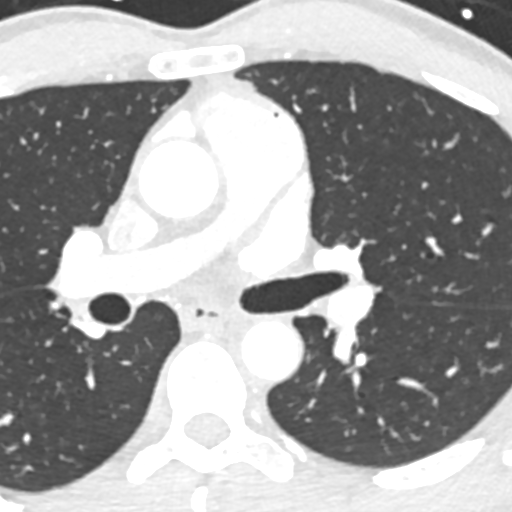
[im 2734/3190  vessel]
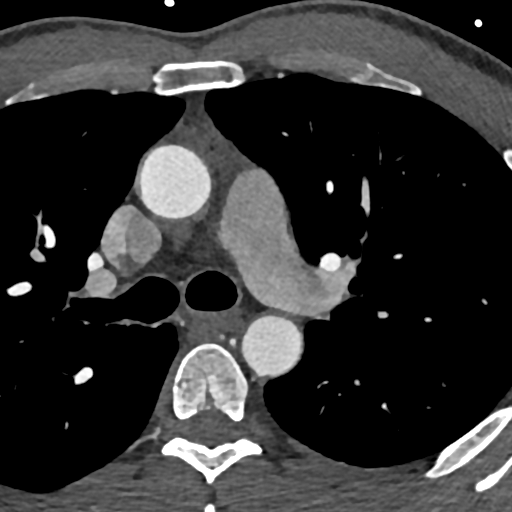

[Series 12: 5-95% · axial · 0.72mm/px · z∈[+1399,+1513]mm · 6 of 3190 slices shown]
[im 456/3190  vessel]
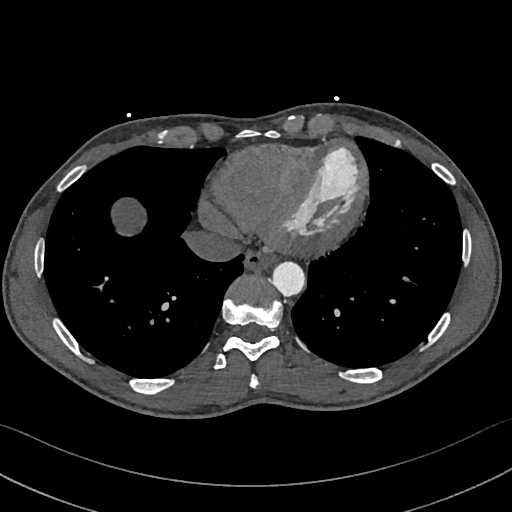
[im 912/3190  vessel]
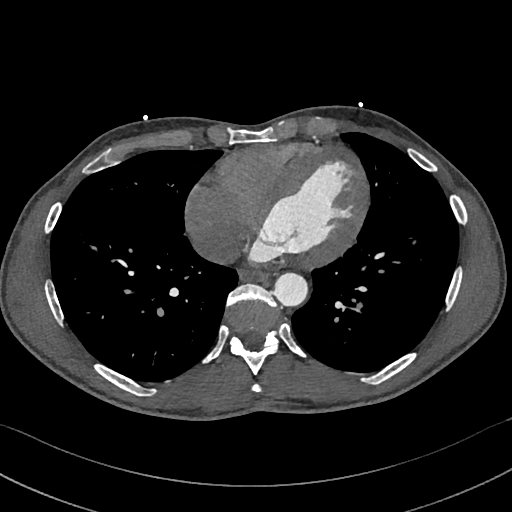
[im 1367/3190  vessel]
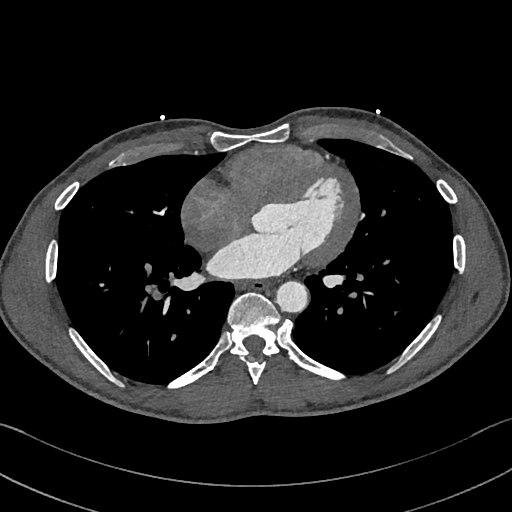
[im 1823/3190  vessel]
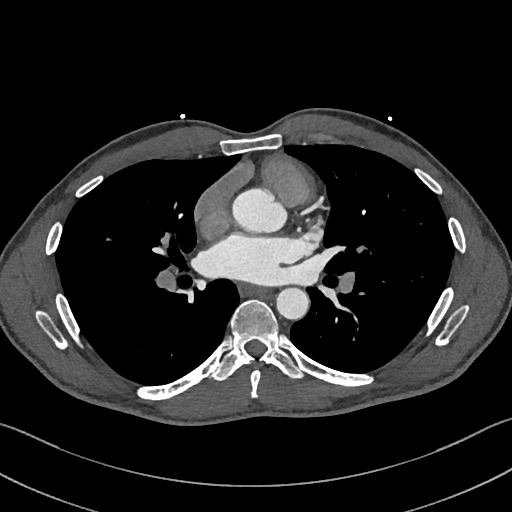
[im 2278/3190  vessel]
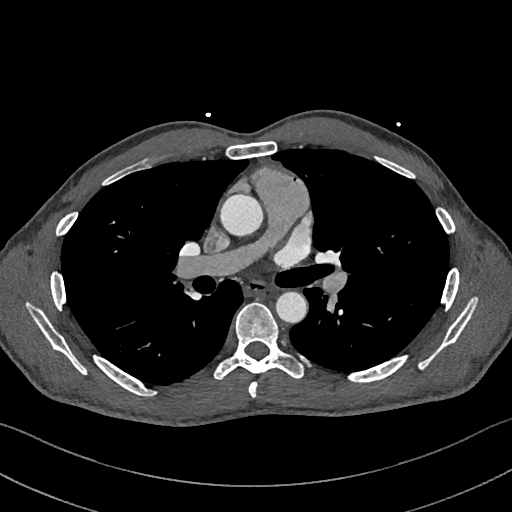
[im 2734/3190  vessel]
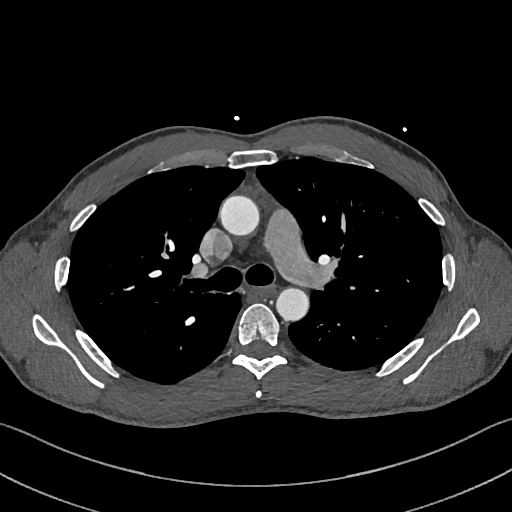

[12 of 20 positions shown; findings below may reference images not displayed]

FINDINGS: Within the visualized portions of the thorax there are no suspicious
appearing pulmonary nodules or masses, there is no acute
consolidative airspace disease, no pleural effusions, no
pneumothorax and no lymphadenopathy. Visualized portions of the
upper abdomen are unremarkable. There are no aggressive appearing
lytic or blastic lesions noted in the visualized portions of the
skeleton.
IMPRESSION: 1. No significant incidental noncardiac findings are noted.
FINDINGS: A 120 kV prospective scan was triggered in the descending thoracic
aorta at 111 HU's. Gantry rotation speed was 280 msecs and
collimation was .9 mm. No beta blockade and no NTG was given. The 3D
data set was reconstructed in 5% intervals of the 60-80 % of the R-R
cycle. Diastolic phases were analyzed on a dedicated work station
using MPR, MIP and VRT modes. The patient received 80 cc of
contrast.

There is normal pulmonary vein drainage into the left atrium (3 on
the right and 2 on the left) with ostial measurements as follows:

RUPV: 15.9 x 12.5 mm

RMPV: 6.3 x 5.1 mm

RLPV: 15.5 x 14.6 mm

There is a long (3 cm) trunk of the left pulmonary vein which is
32.7 x 13.1 mm at the orifice of the LA. It branches to LUPV and
LLPV as below:

LUPV: 13.6 x 10.4 mm

LLPV: 19.5 x 18.5 mm

The left atrial appendage is large chicken wing type with two lobes
and ostial size 32 x 17 mm and length 36 mm. There is no thrombus in
the left atrial appendage.

The esophagus runs in the left atrial midline and is not in the
proximity to any of the pulmonary veins.

Aorta: Normal caliber. Ascending aorta 2.8 cm. No dissection or
calcifications.

Aortic Valve:  Trileaflet.  No calcifications.

Coronary Arteries: Normal coronary origin. Right dominance. The
study was performed without use of NTG and insufficient for plaque
evaluation. Coronary calcium score 0.
IMPRESSION: 1. There is normal pulmonary vein drainage into the left atrium.
There are three veins on the right and two on the left. The left
upper and lower pulmonary veins branch after a 3 cm primary stalk.

2. The left atrial appendage is large chicken wing type with two
lobes and ostial size 32 x 17 mm and length 36 mm. There is no
thrombus in the left atrial appendage.

3. The esophagus runs in the left atrial midline and is not in the
proximity to any of the pulmonary veins.

4. Coronary calcium score 0.

*** End of Addendum ***
EXAM:
OVER-READ INTERPRETATION  CT CHEST

The following report is an over-read performed by radiologist Dr.
Shizuka Kaori [REDACTED] on 08/21/2021. This
over-read does not include interpretation of cardiac or coronary
anatomy or pathology. The coronary calcium score/coronary CTA
interpretation by the cardiologist is attached.
FINDINGS: Within the visualized portions of the thorax there are no suspicious
appearing pulmonary nodules or masses, there is no acute
consolidative airspace disease, no pleural effusions, no
pneumothorax and no lymphadenopathy. Visualized portions of the
upper abdomen are unremarkable. There are no aggressive appearing
lytic or blastic lesions noted in the visualized portions of the
skeleton.
IMPRESSION: 1. No significant incidental noncardiac findings are noted.

## 2024-02-11 ENCOUNTER — Encounter: Attending: Internal Medicine | Admitting: Dietician

## 2024-02-11 ENCOUNTER — Encounter: Payer: Self-pay | Admitting: Dietician

## 2024-02-11 VITALS — Ht 76.0 in | Wt 180.0 lb

## 2024-02-11 DIAGNOSIS — E109 Type 1 diabetes mellitus without complications: Secondary | ICD-10-CM | POA: Insufficient documentation

## 2024-02-11 NOTE — Progress Notes (Signed)
 Diabetes Self-Management Education  Visit Type: First/Initial  Appt. Start Time: 1400 Appt. End Time: 1515  02/14/2024  Mr. Steven Keith, identified by name and date of birth, is a 63 y.o. male with a diagnosis of Diabetes: Type 1.   ASSESSMENT Patient states that he is having problems remembering to bolus prior to meals.  He is also guessing on his carb counts which make him high or low.  76 180 lbs UBW  Referral:  Type 1 Diabetes carb refresher and insulin  pump setting adjustments as well as other aspects of diabetes self management education  History includes:  Type 1 Diabetes (2020), HTN, HLD Medication includes:  Humalog via insulin  pump, Eliquis , Atorvastatin , Hydrochlorothiazide Labs noted to include:   A1C 7.9% 12/20/2023 decreased from 8.3% 01/30/2024, Vitamin B-12 757 10/05/2022  Insulin  pump:  Medtronic 770G with a 7 day infusion set  ICF:  12 a-11 a = 5, 11 a-12 a = 8  ISF:  45 Basal rate 0.3 units/her x 24 hours CGM:  FreeStyle Libre CGM Results from download: 02/11/2024  % Time CGM active:   95 %   (Goal >70%)  Average glucose:   178 mg/dL for 14 days  Glucose management indicator:   7.4 %  Time in range (70-180 mg/dL):   51 %   (Goal >29%)  Time High (181-250 mg/dL):   34 %   (Goal < 74%)  Time Very High (>250 mg/dL):    13 %   (Goal < 5%)  Time Low (54-69 mg/dL):   2 %   (Goal <5%)  Time Very Low (<54 mg/dL):   0 %   (Goal <8%)  %CV (glucose variability)     %  (Goal <36%)    Patient lives with his wife and 2 children.  They share shopping and cooking.  He works days at [employer] that is not air conditioned.  He works 10-12 hour days.  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 He brings a lot of snacks for the work day as his blood glucose goes down significantly when working.   Height 6' 4 (1.93 m), weight 180 lb (81.6 kg). Body mass index is 21.91 kg/m.   Diabetes Self-Management Education - 02/11/24 1430       Visit Information   Visit Type  First/Initial      Initial Visit   Diabetes Type Type 1    Date Diagnosed 2020    Are you currently following a meal plan? No    Are you taking your medications as prescribed? Yes      Health Coping   How would you rate your overall health? Good      Psychosocial Assessment   Patient Belief/Attitude about Diabetes Motivated to manage diabetes    What is the hardest part about your diabetes right now, causing you the most concern, or is the most worrisome to you about your diabetes?   Taking/obtaining medications   remembering to take his insulin  bolus before meals   Self-care barriers None    Self-management support Doctor's office;Family    Patient Concerns Nutrition/Meal planning;Glycemic Control    Special Needs None    Preferred Learning Style No preference indicated    Learning Readiness Ready    How often do you need to have someone help you when you read instructions, pamphlets, or other written materials from your doctor or pharmacy? 1 - Never    What is the last grade level you completed  in school? 12      Pre-Education Assessment   Patient understands the diabetes disease and treatment process. Needs Review    Patient understands incorporating nutritional management into lifestyle. Needs Review    Patient undertands incorporating physical activity into lifestyle. Needs Review    Patient understands using medications safely. Needs Review    Patient understands monitoring blood glucose, interpreting and using results Needs Review    Patient understands prevention, detection, and treatment of acute complications. Needs Review    Patient understands prevention, detection, and treatment of chronic complications. Needs Review    Patient understands how to develop strategies to address psychosocial issues. Needs Review    Patient understands how to develop strategies to promote health/change behavior. Needs Review      Complications   Last HgB A1C per patient/outside source 7.8  %   12/2023   How often do you check your blood sugar? > 4 times/day    Fasting Blood glucose range (mg/dL) 869-820;819-799   eats late as he is concerned about his blood sugar going down overnight   Number of hypoglycemic episodes per month 4    Can you tell when your blood sugar is low? Yes    What do you do if your blood sugar is low? drinks sweet tea    Number of hyperglycemic episodes ( >200mg /dL): Daily    Can you tell when your blood sugar is high? No    Have you had a dilated eye exam in the past 12 months? Yes    Have you had a dental exam in the past 12 months? Yes    Are you checking your feet? No      Dietary Intake   Breakfast cornflakes, 2% milk, jelly sandwich    Snack (morning) sweet cake    Lunch leftovers or [employer] pie or croisant sandwich   9 am   Snack (afternoon) sweet cake or nabs    Dinner spaghetti with meat vegetable sauce    Snack (evening) sometimes    Beverage(s) water      Activity / Exercise   Activity / Exercise Type Light (walking / raking leaves)   work related     Patient Education   Previous Diabetes Education Yes (please comment)   when diagnosed   Healthy Eating Carbohydrate counting;Meal options for control of blood glucose level and chronic complications.;Meal timing in regards to the patients' current diabetes medication.    Being Active Role of exercise on diabetes management, blood pressure control and cardiac health.    Medications Reviewed patients medication for diabetes, action, purpose, timing of dose and side effects.    Monitoring Taught/evaluated CGM (comment);Daily foot exams;Yearly dilated eye exam    Acute complications Taught prevention, symptoms, and  treatment of hypoglycemia - the 15 rule.;Discussed and identified patients' prevention, symptoms, and treatment of hyperglycemia.    Diabetes Stress and Support Identified and addressed patients feelings and concerns about diabetes;Worked with patient to identify barriers to care  and solutions      Individualized Goals (developed by patient)   Nutrition Carb counting    Physical Activity Exercise 5-7 days per week;30 minutes per day    Medications take my medication as prescribed    Monitoring  Consistenly use CGM    Problem Solving Eating Pattern;Addressing barriers to behavior change    Reducing Risk examine blood glucose patterns;do foot checks daily;treat hypoglycemia with 15 grams of carbs if blood glucose less than 70mg /dL    Health Coping  Ask for help with psychological, social, or emotional issues      Post-Education Assessment   Patient understands the diabetes disease and treatment process. Demonstrates understanding / competency    Patient understands incorporating nutritional management into lifestyle. Comprehends key points    Patient undertands incorporating physical activity into lifestyle. Comprehends key points    Patient understands using medications safely. Comphrehends key points    Patient understands monitoring blood glucose, interpreting and using results Comprehends key points    Patient understands prevention, detection, and treatment of acute complications. Comprehends key points    Patient understands prevention, detection, and treatment of chronic complications. Comprehends key points    Patient understands how to develop strategies to address psychosocial issues. Comprehends key points    Patient understands how to develop strategies to promote health/change behavior. Needs Review      Outcomes   Expected Outcomes Demonstrated interest in learning. Expect positive outcomes    Future DMSE PRN    Program Status Not Completed          Individualized Plan for Diabetes Self-Management Training:   Learning Objective:  Patient will have a greater understanding of diabetes self-management. Patient education plan is to attend individual and/or group sessions per assessed needs and concerns.   Plan:   There are no Patient Instructions  on file for this visit.  Expected Outcomes:  Demonstrated interest in learning. Expect positive outcomes  Education material provided: ADA - How to Thrive: A Guide for Your Journey with Diabetes, Meal plan card, Snack sheet, and Diabetes Resources  If problems or questions, patient to contact team via:  Phone  Future DSME appointment: PRN

## 2024-02-14 ENCOUNTER — Encounter: Payer: Self-pay | Admitting: Dietician

## 2024-03-17 ENCOUNTER — Telehealth: Payer: Self-pay | Admitting: Podiatry

## 2024-03-30 ENCOUNTER — Ambulatory Visit (INDEPENDENT_AMBULATORY_CARE_PROVIDER_SITE_OTHER)

## 2024-03-30 ENCOUNTER — Ambulatory Visit: Admitting: Podiatry

## 2024-03-30 ENCOUNTER — Encounter: Payer: Self-pay | Admitting: Podiatry

## 2024-03-30 VITALS — Ht 76.0 in | Wt 180.0 lb

## 2024-03-30 DIAGNOSIS — Q6672 Congenital pes cavus, left foot: Secondary | ICD-10-CM

## 2024-03-30 DIAGNOSIS — M7752 Other enthesopathy of left foot: Secondary | ICD-10-CM | POA: Diagnosis not present

## 2024-03-30 NOTE — Progress Notes (Signed)
   Chief Complaint  Patient presents with   Toe Pain    Pt is here due to left great toe pain, states the pain has been there for about 3-4 months, takes OTC medication for the pain with some relief, pt is a diabetic.    HPI: 63 y.o. male presenting today as a new patient for above complaint  Past Medical History:  Diagnosis Date   Abnormal EKG    Chest pain    COVID    Diabetes mellitus without complication (HCC)    HTN (hypertension)    Hyperlipidemia    LVH (left ventricular hypertrophy) due to hypertensive disease    OSA on CPAP    Palpitations    Paroxysmal atrial fibrillation (HCC)    Shift work sleep disorder    Somnolence     Past Surgical History:  Procedure Laterality Date   ATRIAL FIBRILLATION ABLATION N/A 08/28/2021   Procedure: ATRIAL FIBRILLATION ABLATION;  Surgeon: Inocencio Soyla Lunger, MD;  Location: [facility];  Service: Cardiovascular;  Laterality: N/A;   BACK SURGERY     CARDIAC CATHETERIZATION  2007    No Known Allergies   Physical Exam: General: The patient is alert and oriented x3 in no acute distress.  Dermatology: Skin is warm, dry and supple bilateral lower extremities.   Vascular: Palpable pedal pulses bilaterally. Capillary refill within normal limits.  No appreciable edema.  No erythema.  Neurological: Grossly intact via light touch  Musculoskeletal Exam: Tenderness with palpation noted to the distal lateral aspect of the left great toe.  The toenail is not tender.  There is also tenderness with palpation range of motion of the fifth MTP of the left foot  Radiographic Exam LT foot 03/30/2024:  Normal osseous mineralization. Joint spaces preserved.  No fractures or osseous irregularities noted.  Impression: Negative  Assessment/Plan of Care: 1.  Fifth MTP capsulitis left 2.  Lateral column pain secondary to high arches 3.  Left great toe pain distal tip   -Patient evaluated.  X-rays reviewed -Declined cortisone injection -Due  to the high arches and lateral column loading of the foot, I do believe that custom molded orthotics will help to support the medial longitudinal arch of the foot and offload pressure from the forefoot. -Appointment with orthotics department for custom molded orthotics fitting -Silicone toe cap was dispensed to apply over the left great toe to see if this helps alleviate any pressure pain -Recommend shoes that do not rub or irritate the great toe and allow plenty of room in the toebox area -Return to clinic about 6 weeks after orthotics dispensable      Thresa EMERSON Sar, DPM [facility]  Dr. Thresa EMERSON Sar, DPM    [address] [place], [place] [address]                Office [phone]  Fax [phone]

## 2024-05-04 ENCOUNTER — Ambulatory Visit

## 2024-05-04 NOTE — Progress Notes (Signed)
 Orthotics   Patient was present and evaluated for Custom molded foot orthotics. Patient will benefit from CFO's to provide total contact to BIL MLA's helping to balance and distribute body weight more evenly across BIL feet helping to reduce plantar pressure and pain. Orthotic will also encourage FF / RF alignment  Patient was scanned today and will return for fitting upon receipt  Deductible met for year 15% coins remains patient is aware   Lolita Schultze CPed

## 2024-06-01 ENCOUNTER — Ambulatory Visit

## 2024-06-01 NOTE — Progress Notes (Signed)
 Patient came in and orthotics were not here part of customs issue Cristie has shipped new pair last Friday  When in I will mail to patient  Patient was instructed on how to fit into shoe  Lolita Schultze, Cped, CFo

## 2024-06-03 ENCOUNTER — Telehealth: Payer: Self-pay

## 2024-06-03 NOTE — Telephone Encounter (Signed)
 Orthotics are here Charges not entered yet Financial form signed and on file Per last note- orthotics are to be mailed to pt
